# Patient Record
Sex: Female | Born: 1990 | Race: Black or African American | Hispanic: No | Marital: Single | Smoking: Current every day smoker
Health system: Southern US, Community
[De-identification: ages and names within clinical notes are randomized; demographics above are authoritative.]

## PROBLEM LIST (undated history)

## (undated) ENCOUNTER — Inpatient Hospital Stay (HOSPITAL_COMMUNITY): Payer: Self-pay

## (undated) DIAGNOSIS — B009 Herpesviral infection, unspecified: Secondary | ICD-10-CM

## (undated) HISTORY — DX: Herpesviral infection, unspecified: B00.9

## (undated) HISTORY — PX: NO PAST SURGERIES: SHX2092

---
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
## 2012-07-11 ENCOUNTER — Encounter (HOSPITAL_COMMUNITY): Payer: Self-pay | Admitting: Emergency Medicine

## 2012-07-11 ENCOUNTER — Emergency Department (HOSPITAL_COMMUNITY)
Admission: EM | Admit: 2012-07-11 | Discharge: 2012-07-11 | Disposition: A | Discharge status: Home / Self Care | Payer: Medicaid Other | Attending: Emergency Medicine | Admitting: Emergency Medicine

## 2012-07-11 DIAGNOSIS — N898 Other specified noninflammatory disorders of vagina: Secondary | ICD-10-CM | POA: Insufficient documentation

## 2012-07-11 DIAGNOSIS — Z3202 Encounter for pregnancy test, result negative: Secondary | ICD-10-CM | POA: Insufficient documentation

## 2012-07-11 DIAGNOSIS — N390 Urinary tract infection, site not specified: Secondary | ICD-10-CM | POA: Insufficient documentation

## 2012-07-11 DIAGNOSIS — F17200 Nicotine dependence, unspecified, uncomplicated: Secondary | ICD-10-CM | POA: Insufficient documentation

## 2012-07-11 DIAGNOSIS — A6000 Herpesviral infection of urogenital system, unspecified: Secondary | ICD-10-CM | POA: Insufficient documentation

## 2012-07-11 LAB — WET PREP, GENITAL
Trich, Wet Prep: NONE SEEN
Yeast Wet Prep HPF POC: NONE SEEN

## 2012-07-11 LAB — URINALYSIS, ROUTINE W REFLEX MICROSCOPIC
Protein, ur: 100 mg/dL — AB
Urobilinogen, UA: 4 mg/dL — ABNORMAL HIGH (ref 0.0–1.0)

## 2012-07-11 LAB — URINE MICROSCOPIC-ADD ON

## 2012-07-11 LAB — POCT PREGNANCY, URINE: Preg Test, Ur: NEGATIVE

## 2012-07-11 MED ORDER — NITROFURANTOIN MONOHYD MACRO 100 MG PO CAPS
100.0000 mg | ORAL_CAPSULE | Freq: Once | ORAL | Status: AC
  Administered 2012-07-11: 100 mg via ORAL
  Filled 2012-07-11: qty 1

## 2012-07-11 MED ORDER — NITROFURANTOIN MONOHYD MACRO 100 MG PO CAPS: 100.0000 mg | ORAL_CAPSULE | Freq: Two times a day (BID) | ORAL | Status: DC

## 2012-07-11 MED ORDER — HYDROCODONE-ACETAMINOPHEN 5-325 MG PO TABS: 1.0000 | ORAL_TABLET | Freq: Four times a day (QID) | ORAL | Status: DC | PRN

## 2012-07-11 MED ORDER — ONDANSETRON 4 MG PO TBDP
4.0000 mg | ORAL_TABLET | Freq: Once | ORAL | Status: AC
  Administered 2012-07-11: 4 mg via ORAL
  Filled 2012-07-11: qty 1

## 2012-07-11 MED ORDER — AZITHROMYCIN 1 G PO PACK
1.0000 g | PACK | Freq: Once | ORAL | Status: AC
  Administered 2012-07-11: 1 g via ORAL
  Filled 2012-07-11: qty 1

## 2012-07-11 MED ORDER — PHENAZOPYRIDINE HCL 200 MG PO TABS
200.0000 mg | ORAL_TABLET | Freq: Three times a day (TID) | ORAL | Status: DC
  Administered 2012-07-11: 200 mg via ORAL
  Filled 2012-07-11: qty 1

## 2012-07-11 MED ORDER — LIDOCAINE HCL 2 % EX GEL
Freq: Once | CUTANEOUS | Status: DC
  Filled 2012-07-11: qty 10

## 2012-07-11 MED ORDER — CEFTRIAXONE SODIUM 250 MG IJ SOLR
250.0000 mg | Freq: Once | INTRAMUSCULAR | Status: AC
  Administered 2012-07-11: 250 mg via INTRAMUSCULAR
  Filled 2012-07-11: qty 250

## 2012-07-11 MED ORDER — VALACYCLOVIR HCL 1 G PO TABS: 1000.0000 mg | ORAL_TABLET | Freq: Two times a day (BID) | ORAL | Status: AC

## 2012-07-11 MED ORDER — METRONIDAZOLE 500 MG PO TABS
2000.0000 mg | ORAL_TABLET | Freq: Once | ORAL | Status: AC
  Administered 2012-07-11: 2000 mg via ORAL
  Filled 2012-07-11: qty 4

## 2012-07-11 MED ORDER — HYDROCODONE-ACETAMINOPHEN 5-325 MG PO TABS
2.0000 | ORAL_TABLET | Freq: Once | ORAL | Status: AC
  Administered 2012-07-11: 2 via ORAL
  Filled 2012-07-11: qty 2

## 2012-07-11 NOTE — ED Provider Notes (Signed)
History     CSN: [id number]  Arrival date & time 07/11/12  0225   First MD Initiated Contact with Patient 07/11/12 [phone]      Chief Complaint  Patient presents with  . Vaginal Pain    (Consider location/radiation/quality/duration/timing/severity/associated sxs/prior treatment) HPI Brooke Salas is a 22 y.o. female who is had prior sexually transmitted infections x3 been having dysuria, frequency, or complaints of Lister's in her vaginal area, vaginal pain for about a week.  She says it's more painful to sit down in a chair and to walk. She also notes a worsening vaginal discharge. Pain is severe, 10 out of 10, sharp and burning worse with palpation or touching the area. Patient is currently sexual active with multiple partners.  History reviewed. No pertinent past medical history.  History reviewed. No pertinent past surgical history.  History reviewed. No pertinent family history.  History  Substance Use Topics  . Smoking status: Current Every Day Smoker -- 1.00 packs/day    Types: Cigarettes  . Smokeless tobacco: Not on file  . Alcohol Use: No    OB History   Grav Para Term Preterm Abortions TAB SAB Ect Mult Living                  Review of Systems At least 10pt or greater review of systems completed and are negative except where specified in the HPI.  Allergies  Review of patient's allergies indicates no known allergies.  Home Medications   Current Outpatient Rx  Name  Route  Sig  Dispense  Refill  . medroxyPROGESTERone (DEPO-PROVERA) 150 MG/ML injection   Intramuscular   Inject 150 mg into the muscle every 3 (three) months.           BP 133/76  Pulse 132  Temp(Src) 99.6 F (37.6 C) (Oral)  Resp 20  Wt 133 lb 12.8 oz (60.691 kg)  SpO2 100%  Physical Exam  Nursing notes reviewed.  Electronic medical record reviewed. VITAL SIGNS:   Filed Vitals:   07/11/12 0231 07/11/12 0637  BP: 133/76 130/91  Pulse: 132 93  Temp: 99.6 F (37.6 C)   TempSrc:  Oral   Resp: 20 16  Weight: 133 lb 12.8 oz (60.691 kg)   SpO2: 100% 99%   CONSTITUTIONAL: Awake, oriented, appears non-toxic HENT: Atraumatic, normocephalic, oral mucosa pink and moist, airway patent. Nares patent without drainage. External ears normal. EYES: Conjunctiva clear, EOMI, PERRLA NECK: Trachea midline, non-tender, supple CARDIOVASCULAR: Normal heart rate, Normal rhythm, No murmurs, rubs, gallops PULMONARY/CHEST: Clear to auscultation, no rhonchi, wheezes, or rales. Symmetrical breath sounds. Non-tender. ABDOMINAL: Non-distended, soft, non-tender - no rebound or guarding.  BS normal. NEUROLOGIC: Non-focal, moving all four extremities, no gross sensory or motor deficits. PELVIC EXAM: external genitalia with multiple ulcerations over the labia majora and minora, white discharge at full flow, vagina walls appear irritated, cervix with mucopurulent discharge, uterus and adnexa without abnormalities-no cervical motion tenderness EXTREMITIES: No clubbing, cyanosis, or edema SKIN: Warm, Dry, No erythema, No rash  ED Course  Procedures (including critical care time)  Labs Reviewed  WET PREP, GENITAL - Abnormal; Notable for the following:    Clue Cells Wet Prep HPF POC FEW (*)    WBC, Wet Prep HPF POC MANY (*)    All other components within normal limits  URINALYSIS, ROUTINE W REFLEX MICROSCOPIC - Abnormal; Notable for the following:    Color, Urine AMBER (*)    APPearance CLOUDY (*)    Specific Gravity, Urine  1.034 (*)    Hgb urine dipstick SMALL (*)    Bilirubin Urine SMALL (*)    Protein, ur 100 (*)    Urobilinogen, UA 4.0 (*)    Leukocytes, UA MODERATE (*)    All other components within normal limits  URINE MICROSCOPIC-ADD ON - Abnormal; Notable for the following:    Squamous Epithelial / LPF FEW (*)    Casts HYALINE CASTS (*)    All other components within normal limits  GC/CHLAMYDIA PROBE AMP  URINE CULTURE  POCT PREGNANCY, URINE   No results found.   1. Genital  herpes   2. Vaginal discharge   3. UTI (lower urinary tract infection)       MDM  Patient's presentation is consistent with PID, versus gonorrhea versus chlamydia, patient obvious has herpes with a greenish mucopurulent discharge. Wet prep does not show any trichomonads. We'll treat her with metronidazole, intramuscular ceftriaxone as well as 2 g and azithromycin. Have urged her to go to the health department complete STD testing including HIV and syphilis.  Patient is urged to return to the emergency department if she has worsening abdominal pain, fever, intractable vomiting or any other concerning symptoms.  Treat the patient's herpes with valacyclovir as well as topical 2% lidocaine jelly.  I explained the diagnosis and have given explicit precautions to return to the ER with any other new or worsening symptoms as dictated. The patient understands and accepts the medical plan as it's been dictated and I have answered their questions. Discharge instructions concerning home care and prescriptions have been given.  The patient is STABLE and is discharged to home in good condition.        Jones Skene, MD 07/11/12 [phone]

## 2012-07-11 NOTE — ED Notes (Signed)
Awaiting medication from pharmacy,  Spoke with Judeth Cornfield and they are going to send it shortly.

## 2012-07-11 NOTE — ED Notes (Signed)
Pt states have burning sensation went urinating, mother noted blisters in vaginal area. Underwear irritates it and sitting down in a chair is painful to pt. Pt also states white creamy vaginal discharge.

## 2012-07-13 LAB — URINE CULTURE

## 2012-07-13 LAB — GC/CHLAMYDIA PROBE AMP: GC Probe RNA: NEGATIVE

## 2012-07-14 ENCOUNTER — Telehealth (HOSPITAL_COMMUNITY): Payer: Self-pay | Admitting: Emergency Medicine

## 2012-07-14 NOTE — ED Notes (Signed)
Results received from [facility].  (+) URNC  Rx given in ED for Nitrofurantoin -> sensitive to the same.  Chart appended per protocol. 

## 2013-05-17 ENCOUNTER — Inpatient Hospital Stay (HOSPITAL_COMMUNITY)
Admission: EM | Admit: 2013-05-17 | Discharge: 2013-05-20 | DRG: 871 | Disposition: A | Discharge status: Home / Self Care | Payer: Self-pay | Attending: Internal Medicine | Admitting: Internal Medicine

## 2013-05-17 ENCOUNTER — Emergency Department (HOSPITAL_COMMUNITY): Payer: Medicaid Other

## 2013-05-17 ENCOUNTER — Encounter (HOSPITAL_COMMUNITY): Payer: Self-pay | Admitting: Emergency Medicine

## 2013-05-17 DIAGNOSIS — E871 Hypo-osmolality and hyponatremia: Secondary | ICD-10-CM | POA: Diagnosis present

## 2013-05-17 DIAGNOSIS — I498 Other specified cardiac arrhythmias: Secondary | ICD-10-CM | POA: Diagnosis present

## 2013-05-17 DIAGNOSIS — A419 Sepsis, unspecified organism: Principal | ICD-10-CM | POA: Diagnosis present

## 2013-05-17 DIAGNOSIS — R112 Nausea with vomiting, unspecified: Secondary | ICD-10-CM | POA: Diagnosis present

## 2013-05-17 DIAGNOSIS — Z79899 Other long term (current) drug therapy: Secondary | ICD-10-CM

## 2013-05-17 DIAGNOSIS — F17200 Nicotine dependence, unspecified, uncomplicated: Secondary | ICD-10-CM | POA: Diagnosis present

## 2013-05-17 DIAGNOSIS — E876 Hypokalemia: Secondary | ICD-10-CM | POA: Diagnosis present

## 2013-05-17 DIAGNOSIS — D72829 Elevated white blood cell count, unspecified: Secondary | ICD-10-CM | POA: Diagnosis present

## 2013-05-17 DIAGNOSIS — J189 Pneumonia, unspecified organism: Secondary | ICD-10-CM | POA: Diagnosis present

## 2013-05-17 DIAGNOSIS — R000 Tachycardia, unspecified: Secondary | ICD-10-CM

## 2013-05-17 MED ORDER — MORPHINE SULFATE 4 MG/ML IJ SOLN
4.0000 mg | Freq: Once | INTRAMUSCULAR | Status: AC
Start: 2013-05-17 — End: 2013-05-17
  Administered 2013-05-17: 4 mg via INTRAVENOUS
  Filled 2013-05-17: qty 1

## 2013-05-17 MED ORDER — ONDANSETRON HCL 4 MG/2ML IJ SOLN
4.0000 mg | Freq: Once | INTRAMUSCULAR | Status: AC
  Administered 2013-05-17: 4 mg via INTRAVENOUS
  Filled 2013-05-17: qty 2

## 2013-05-17 MED ORDER — SODIUM CHLORIDE 0.9 % IV BOLUS (SEPSIS)
1000.0000 mL | Freq: Once | INTRAVENOUS | Status: AC
  Administered 2013-05-17: 1000 mL via INTRAVENOUS

## 2013-05-17 MED ORDER — ACETAMINOPHEN 325 MG PO TABS
650.0000 mg | ORAL_TABLET | Freq: Once | ORAL | Status: AC
  Administered 2013-05-17: 650 mg via ORAL
  Filled 2013-05-17: qty 2

## 2013-05-17 NOTE — ED Provider Notes (Signed)
CSN: [id number]     Arrival date & time 05/17/13  2207 History   First MD Initiated Contact with Patient 05/17/13 2214     Chief Complaint  Patient presents with  . N/V/D   . Fever     (Consider location/radiation/quality/duration/timing/severity/associated sxs/prior Treatment) HPI Patient is a 23 year old female presenting with complaints of fever, nausea, vomiting, diarrhea, body aches, headache and cough for the past 4 days. The pain is diffuse and constant with worsening intensity beginning today which prompted her to be seen. Patient states she has a headache, neck pain and right ear pain. She has been drinking water and ginger ale to stay hydrated because she has had several episodes of vomiting and has had diarrhea once. Many of her family members have been sick recently but not to this extent. She tried taking 2 aleve but that did not relieve the pain.    History reviewed. No pertinent past medical history. History reviewed. No pertinent past surgical history. No family history on file. History  Substance Use Topics  . Smoking status: Current Every Day Smoker -- 1.00 packs/day    Types: Cigarettes  . Smokeless tobacco: Not on file  . Alcohol Use: No   OB History   Grav Para Term Preterm Abortions TAB SAB Ect Mult Living                 Review of Systems  Constitutional: Positive for fever, chills and fatigue.  HENT: Positive for ear pain.   Respiratory: Positive for shortness of breath.   Gastrointestinal: Positive for nausea, vomiting and diarrhea.  Genitourinary: Negative for dysuria.  Musculoskeletal: Positive for neck pain.  Neurological: Positive for light-headedness.    On review of systems, patient endorses fever, chills, headache, body aches, lightheadedness, neck pain, cough, nausea, vomiting, diarrhea. Patient denies hemoptysis, dysuria and constipation.  Allergies  Review of patient's allergies indicates no known allergies.  Home Medications   Current  Outpatient Rx  Name  Route  Sig  Dispense  Refill  . medroxyPROGESTERone (DEPO-PROVERA) 150 MG/ML injection   Intramuscular   Inject 150 mg into the muscle every 3 (three) months.          BP 122/66  Pulse 137  Temp(Src) 100.7 F (38.2 C) (Oral)  Resp 22  Ht 5\' 3"  (1.6 m)  Wt 135 lb (61.236 kg)  BMI 23.92 kg/m2  SpO2 100% Physical Exam  Constitutional: She appears distressed.  HENT:  Ears: Right TM mildly erythematous without effusion, left TM with normal appearance  Nose: Normal nares, no rhinorrhea   throat: Uvula is midline, no tonsillar enlargement or exudates, no trismus the    Neck: Normal range of motion.  Cardiovascular: Normal heart sounds.  Tachycardia present.  Exam reveals no gallop and no friction rub.   No murmur heard. Pulmonary/Chest: Tachypnea noted. She has rales in the left lower field.  Abdominal: There is no tenderness. There is no guarding and no CVA tenderness.  Skin: Skin is warm.    On physical exam, patient appears distressed. She is febrile, tachycardic, tachypneic, and normotensive. Patient is able to touch her chin to her chest. No murmurs, rubs or gallops. Rales heard posteriorly in base of left lung. Otherwise clear throughout. There is no guarding or evidence of abdominal pain upon palpation.    ED Course  Procedures (including critical care time)  1:10 AM Patient presents with initially URI symptoms. She was febrile, tachypneic and tachycardic without any wheezes. Chest x-ray demonstrated  complete consolidations of the left lower lobe suggestive of pneumonia. Patient endorse nausea and vomiting has had multiple bouts of vomitus. No obvious signs of hypotension. And she was responding to IV fluid. Levaquin given for community-acquired pneumonia. Due to her nausea vomiting, tachycardia, and febrile, we'll have her manage further in the hospital. Call for admission.  i have consulted with [facility], Dr. Onalee Huaavid, who will see pt in ER and  will admit for further care.    Labs Review Labs Reviewed  CBC WITH DIFFERENTIAL - Abnormal; Notable for the following:    WBC 14.1 (*)    Neutrophils Relative % 90 (*)    Neutro Abs 12.8 (*)    Lymphocytes Relative 6 (*)    All other components within normal limits  HEPATIC FUNCTION PANEL - Abnormal; Notable for the following:    Albumin 3.3 (*)    Total Bilirubin 1.4 (*)    Indirect Bilirubin 1.1 (*)    All other components within normal limits  I-STAT CHEM 8, ED - Abnormal; Notable for the following:    Sodium 134 (*)    Potassium 3.0 (*)    BUN 4 (*)    Glucose, Bld 115 (*)    Calcium, Ion 1.02 (*)    All other components within normal limits  RAPID STREP SCREEN  CULTURE, GROUP A STREP  LIPASE, BLOOD  I-STAT CG4 LACTIC ACID, ED   Imaging Review Dg Chest 2 View  05/18/2013   CLINICAL DATA:  Cough.  Fever.  EXAM: CHEST  2 VIEW  COMPARISON:  None.  FINDINGS: Almost complete consolidation of the left lower lobe. Recommend follow-up until clearance to exclude central obstructing lesion.  Central pulmonary vascular prominence.  Heart size within normal limits.  Scoliosis convex right.  No gross pneumothorax.  IMPRESSION: Almost complete consolidation left lung consistent with pneumonia. Recommend followup until clearance.  Scoliosis convex right.  Central pulmonary vascular prominence.  These results were called by telephone at the time of interpretation on 05/18/2013 at 12:42 AM to Dr. Fayrene HelperBOWIE Betzabe Bevans , who verbally acknowledged these results.   Electronically Signed   By: Bridgett LarssonSteve  Olson M.D.   On: 05/18/2013 00:42     EKG Interpretation   Date/Time:  Monday May 17 2013 23:44:12 EST Ventricular Rate:  134 PR Interval:  140 QRS Duration: 82 QT Interval:  296 QTC Calculation: 442 R Axis:   74 Text Interpretation:  Age not entered, assumed to be  23 years old for  purpose of ECG interpretation Sinus tachycardia Borderline T  abnormalities, diffuse leads Confirmed by HARRISON  MD,  FORREST ([id number]) on  05/18/2013 12:06:18 AM      MDM   Final diagnoses:  CAP (community acquired pneumonia)  Hypokalemia   BP 106/56  Pulse 127  Temp(Src) 103.5 F (39.7 C) (Rectal)  Resp 41  Ht 5\' 3"  (1.6 m)  Wt 135 lb (61.236 kg)  BMI 23.92 kg/m2  SpO2 97%  I have reviewed nursing notes and vital signs. I personally reviewed the imaging tests through PACS system  I reviewed available ER/hospitalization records thought the EMR     Fayrene HelperBowie Lonette Stevison, [place]-C 05/18/13 0112

## 2013-05-17 NOTE — ED Notes (Signed)
Pt c/o n/v/d, body aches, abdominal pain, headache and cough for the past 3 days. Pt states she has vomited 5 x today.

## 2013-05-18 DIAGNOSIS — E876 Hypokalemia: Secondary | ICD-10-CM

## 2013-05-18 DIAGNOSIS — R112 Nausea with vomiting, unspecified: Secondary | ICD-10-CM | POA: Diagnosis present

## 2013-05-18 DIAGNOSIS — I498 Other specified cardiac arrhythmias: Secondary | ICD-10-CM

## 2013-05-18 DIAGNOSIS — R000 Tachycardia, unspecified: Secondary | ICD-10-CM | POA: Diagnosis present

## 2013-05-18 DIAGNOSIS — J189 Pneumonia, unspecified organism: Secondary | ICD-10-CM | POA: Diagnosis present

## 2013-05-18 HISTORY — DX: Hypokalemia: E87.6

## 2013-05-18 HISTORY — DX: Pneumonia, unspecified organism: J18.9

## 2013-05-18 HISTORY — DX: Nausea with vomiting, unspecified: R11.2

## 2013-05-18 LAB — I-STAT CG4 LACTIC ACID, ED: Lactic Acid, Venous: 1.77 mmol/L (ref 0.5–2.2)

## 2013-05-18 LAB — CBC WITH DIFFERENTIAL/PLATELET
Basophils Absolute: 0 10*3/uL (ref 0.0–0.1)
Basophils Absolute: 0 10*3/uL (ref 0.0–0.1)
Basophils Relative: 0 % (ref 0–1)
Basophils Relative: 0 % (ref 0–1)
EOS PCT: 0 % (ref 0–5)
Eosinophils Absolute: 0 10*3/uL (ref 0.0–0.7)
Eosinophils Absolute: 0 10*3/uL (ref 0.0–0.7)
Eosinophils Relative: 0 % (ref 0–5)
HCT: 38 % (ref 36.0–46.0)
HEMATOCRIT: 33.7 % — AB (ref 36.0–46.0)
HEMOGLOBIN: 13.5 g/dL (ref 12.0–15.0)
Hemoglobin: 12 g/dL (ref 12.0–15.0)
LYMPHS ABS: 0.8 10*3/uL (ref 0.7–4.0)
Lymphocytes Relative: 6 % — ABNORMAL LOW (ref 12–46)
Lymphocytes Relative: 7 % — ABNORMAL LOW (ref 12–46)
Lymphs Abs: 0.8 10*3/uL (ref 0.7–4.0)
MCH: 31.3 pg (ref 26.0–34.0)
MCH: 31.4 pg (ref 26.0–34.0)
MCHC: 35.5 g/dL (ref 30.0–36.0)
MCHC: 35.6 g/dL (ref 30.0–36.0)
MCV: 88.2 fL (ref 78.0–100.0)
MCV: 88.2 fL (ref 78.0–100.0)
MONO ABS: 0.6 10*3/uL (ref 0.1–1.0)
MONO ABS: 0.4 10*3/uL (ref 0.1–1.0)
MONOS PCT: 4 % (ref 3–12)
Monocytes Relative: 4 % (ref 3–12)
Neutro Abs: 12.8 10*3/uL — ABNORMAL HIGH (ref 1.7–7.7)
Neutro Abs: 10 10*3/uL — ABNORMAL HIGH (ref 1.7–7.7)
Neutrophils Relative %: 90 % — ABNORMAL HIGH (ref 43–77)
Neutrophils Relative %: 90 % — ABNORMAL HIGH (ref 43–77)
Platelets: 225 10*3/uL (ref 150–400)
Platelets: 233 10*3/uL (ref 150–400)
RBC: 4.31 MIL/uL (ref 3.87–5.11)
RBC: 3.82 MIL/uL — AB (ref 3.87–5.11)
RDW: 13.7 % (ref 11.5–15.5)
RDW: 13.7 % (ref 11.5–15.5)
WBC: 14.1 10*3/uL — ABNORMAL HIGH (ref 4.0–10.5)
WBC: 11.2 10*3/uL — AB (ref 4.0–10.5)

## 2013-05-18 LAB — BASIC METABOLIC PANEL
BUN: 6 mg/dL (ref 6–23)
CHLORIDE: 99 meq/L (ref 96–112)
CO2: 22 meq/L (ref 19–32)
CREATININE: 0.85 mg/dL (ref 0.50–1.10)
Calcium: 8.1 mg/dL — ABNORMAL LOW (ref 8.4–10.5)
GFR calc Af Amer: >90 mL/min (ref >90)
GFR calc non Af Amer: >90 mL/min (ref >90)
GLUCOSE: 168 mg/dL — AB (ref 70–99)
Potassium: 3.8 mEq/L (ref 3.7–5.3)
Sodium: 135 mEq/L — ABNORMAL LOW (ref 137–147)

## 2013-05-18 LAB — I-STAT CHEM 8, ED
BUN: 4 mg/dL — ABNORMAL LOW (ref 6–23)
CALCIUM ION: 1.02 mmol/L — AB (ref 1.12–1.23)
Chloride: 96 mEq/L (ref 96–112)
Creatinine, Ser: 0.9 mg/dL (ref 0.50–1.10)
GLUCOSE: 115 mg/dL — AB (ref 70–99)
HEMATOCRIT: 42 % (ref 36.0–46.0)
Hemoglobin: 14.3 g/dL (ref 12.0–15.0)
Potassium: 3 mEq/L — ABNORMAL LOW (ref 3.7–5.3)
Sodium: 134 mEq/L — ABNORMAL LOW (ref 137–147)
TCO2: 23 mmol/L (ref 0–100)

## 2013-05-18 LAB — HEPATIC FUNCTION PANEL
ALT: 22 U/L (ref 0–35)
AST: 17 U/L (ref 0–37)
Albumin: 3.3 g/dL — ABNORMAL LOW (ref 3.5–5.2)
Alkaline Phosphatase: 82 U/L (ref 39–117)
BILIRUBIN DIRECT: 0.3 mg/dL (ref 0.0–0.3)
BILIRUBIN INDIRECT: 1.1 mg/dL — AB (ref 0.3–0.9)
Total Bilirubin: 1.4 mg/dL — ABNORMAL HIGH (ref 0.3–1.2)
Total Protein: 7.6 g/dL (ref 6.0–8.3)

## 2013-05-18 LAB — LIPASE, BLOOD: LIPASE: 11 U/L (ref 11–59)

## 2013-05-18 LAB — INFLUENZA PANEL BY PCR (TYPE A & B)
H1N1 flu by pcr: NOT DETECTED
Influenza A By PCR: NEGATIVE
Influenza B By PCR: NEGATIVE

## 2013-05-18 LAB — LEGIONELLA ANTIGEN, URINE: Legionella Antigen, Urine: NEGATIVE

## 2013-05-18 LAB — RAPID STREP SCREEN (MED CTR MEBANE ONLY): STREPTOCOCCUS, GROUP A SCREEN (DIRECT): NEGATIVE

## 2013-05-18 LAB — HIV ANTIBODY (ROUTINE TESTING W REFLEX): HIV: NONREACTIVE

## 2013-05-18 LAB — STREP PNEUMONIAE URINARY ANTIGEN: STREP PNEUMO URINARY ANTIGEN: NEGATIVE

## 2013-05-18 MED ORDER — POTASSIUM CHLORIDE CRYS ER 20 MEQ PO TBCR
40.0000 meq | EXTENDED_RELEASE_TABLET | Freq: Once | ORAL | Status: AC
  Administered 2013-05-18: 40 meq via ORAL
  Filled 2013-05-18: qty 2

## 2013-05-18 MED ORDER — LEVOFLOXACIN IN D5W 750 MG/150ML IV SOLN
750.0000 mg | INTRAVENOUS | Status: DC
  Administered 2013-05-18 – 2013-05-20 (×3): 750 mg via INTRAVENOUS
  Filled 2013-05-18 (×4): qty 150

## 2013-05-18 MED ORDER — IPRATROPIUM BROMIDE 0.02 % IN SOLN: 0.5000 mg | Freq: Once | RESPIRATORY_TRACT | Status: DC

## 2013-05-18 MED ORDER — POTASSIUM CHLORIDE IN NACL 40-0.9 MEQ/L-% IV SOLN
INTRAVENOUS | Status: AC
  Administered 2013-05-18: 04:00:00 via INTRAVENOUS
  Filled 2013-05-18 (×4): qty 1000

## 2013-05-18 MED ORDER — ACETAMINOPHEN 325 MG PO TABS
650.0000 mg | ORAL_TABLET | Freq: Four times a day (QID) | ORAL | Status: DC | PRN
  Administered 2013-05-18 – 2013-05-20 (×3): 650 mg via ORAL
  Filled 2013-05-18 (×3): qty 2

## 2013-05-18 MED ORDER — POTASSIUM CHLORIDE 10 MEQ/100ML IV SOLN
10.0000 meq | Freq: Once | INTRAVENOUS | Status: AC
  Administered 2013-05-18: 10 meq via INTRAVENOUS
  Filled 2013-05-18: qty 100

## 2013-05-18 MED ORDER — LEVOFLOXACIN IN D5W 750 MG/150ML IV SOLN
750.0000 mg | Freq: Once | INTRAVENOUS | Status: AC
Start: 2013-05-18 — End: 2013-05-18
  Filled 2013-05-18: qty 150

## 2013-05-18 MED ORDER — ONDANSETRON HCL 4 MG/2ML IJ SOLN: 4.0000 mg | Freq: Four times a day (QID) | INTRAMUSCULAR | Status: DC | PRN

## 2013-05-18 MED ORDER — SODIUM CHLORIDE 0.9 % IV SOLN
INTRAVENOUS | Status: AC
  Administered 2013-05-18: 21:00:00 via INTRAVENOUS

## 2013-05-18 MED ORDER — GUAIFENESIN-DM 100-10 MG/5ML PO SYRP
5.0000 mL | ORAL_SOLUTION | ORAL | Status: DC | PRN
  Administered 2013-05-18 – 2013-05-19 (×3): 5 mL via ORAL
  Filled 2013-05-18 (×3): qty 10

## 2013-05-18 MED ORDER — SODIUM CHLORIDE 0.9 % IV BOLUS (SEPSIS)
1000.0000 mL | Freq: Once | INTRAVENOUS | Status: AC
Start: 2013-05-18 — End: 2013-05-18
  Administered 2013-05-18: 1000 mL via INTRAVENOUS

## 2013-05-18 MED ORDER — ALBUTEROL SULFATE (2.5 MG/3ML) 0.083% IN NEBU: 5.0000 mg | INHALATION_SOLUTION | Freq: Once | RESPIRATORY_TRACT | Status: DC

## 2013-05-18 NOTE — Progress Notes (Signed)
[facility] PROGRESS NOTE  Brooke Salas ZHY:[id number] DOB: [birth year] DOA: 05/17/2013 PCP: PROVIDER NOT IN SYSTEM  Assessment/Plan  Sepsis (fever to 103.41F and tachcardia) due to CAP, improving.   -  Radiologist recommended follow up CXR to exclude central obstructing lesion -  Continue levofloxacin -  Continue IVF -  F/u BCx -  S. pneumo neg -  Legionella pending -  Flu neg  Sinus tachycardia, trending down on telemetry -  D/c telemetry -  Continue IVF and abx  Leukocytosis, resolving  Hyponatremia and hypokalemia due to dehydration, resolving with IVF and potassium supplement  Diet:  regular Access:  PIV IVF:  yes Proph:  SCDs  Code Status: full Family Communication: patient alone Disposition Plan: pending improvement in HR, possibly home tomrrow   Consultants:  none  Procedures:  CXR  Antibiotics:  Levofloxacin 3/3 >>  HPI/Subjective:  Feeling a little better.  Fever has improved.  Still coughing, but not SOB.   Objective: Filed Vitals:   05/18/13 0100 05/18/13 0254 05/18/13 0552 05/18/13 1334  BP: 113/64 112/73 111/70 112/75  Pulse: 116 118 102 111  Temp:  98.9 F (37.2 C) 97.8 F (36.6 C) 99 F (37.2 C)  TempSrc:  Oral Oral Oral  Resp: 24 22 20 18   Height:  5\' 3"  (1.6 m)    Weight:  52.3 kg (115 lb 4.8 oz)    SpO2: 96% 97% 100% 99%    Intake/Output Summary (Last 24 hours) at 05/18/13 1844 Last data filed at 05/18/13 1752  Gross per 24 hour  Intake 3391.67 ml  Output   2001 ml  Net 1390.67 ml   Filed Weights   05/17/13 2214 05/18/13 0254  Weight: 61.236 kg (135 lb) 52.3 kg (115 lb 4.8 oz)    Exam:   General:  BF, No acute distress  HEENT:  NCAT, MMM  Cardiovascular:  Tachycardic RR, nl S1, S2 no mrg, 2+ pulses, warm extremities  Respiratory:  Very diminished/bronchial BS with egophony at the left base, no increased WOB  Abdomen:   NABS, soft, NT/ND  MSK:   Normal tone and bulk, no LEE  Neuro:  Grossly  intact  Data Reviewed: Basic Metabolic Panel:  Recent Labs Lab 05/18/13 0015 05/18/13 0340  NA 134* 135*  K 3.0* 3.8  CL 96 99  CO2  --  22  GLUCOSE 115* 168*  BUN 4* 6  CREATININE 0.90 0.85  CALCIUM  --  8.1*   Liver Function Tests:  Recent Labs Lab 05/17/13 2300  AST 17  ALT 22  ALKPHOS 82  BILITOT 1.4*  PROT 7.6  ALBUMIN 3.3*    Recent Labs Lab 05/17/13 2300  LIPASE 11   No results found for this basename: AMMONIA,  in the last 168 hours CBC:  Recent Labs Lab 05/17/13 2300 05/18/13 0015 05/18/13 0340  WBC 14.1*  --  11.2*  NEUTROABS 12.8*  --  10.0*  HGB 13.5 14.3 12.0  HCT 38.0 42.0 33.7*  MCV 88.2  --  88.2  PLT 225  --  233   Cardiac Enzymes: No results found for this basename: CKTOTAL, CKMB, CKMBINDEX, TROPONINI,  in the last 168 hours BNP (last 3 results) No results found for this basename: PROBNP,  in the last 8760 hours CBG: No results found for this basename: GLUCAP,  in the last 168 hours  Recent Results (from the past 240 hour(s))  RAPID STREP SCREEN     Status: None   Collection Time  05/18/13 12:26 AM      Result Value Ref Range Status   Streptococcus, Group A Screen (Direct) NEGATIVE  NEGATIVE Final   Comment: (NOTE)     A Rapid Antigen test may result negative if the antigen level in the     sample is below the detection level of this test. The FDA has not     cleared this test as a stand-alone test therefore the rapid antigen     negative result has reflexed to a Group A Strep culture.     Studies: Dg Chest 2 View  05/18/2013   CLINICAL DATA:  Cough.  Fever.  EXAM: CHEST  2 VIEW  COMPARISON:  None.  FINDINGS: Almost complete consolidation of the left lower lobe. Recommend follow-up until clearance to exclude central obstructing lesion.  Central pulmonary vascular prominence.  Heart size within normal limits.  Scoliosis convex right.  No gross pneumothorax.  IMPRESSION: Almost complete consolidation left lung consistent with  pneumonia. Recommend followup until clearance.  Scoliosis convex right.  Central pulmonary vascular prominence.  These results were called by telephone at the time of interpretation on 05/18/2013 at 12:42 AM to Dr. Fayrene HelperBOWIE TRAN , who verbally acknowledged these results.   Electronically Signed   By: Bridgett LarssonSteve  Olson M.D.   On: 05/18/2013 00:42    Scheduled Meds: . levofloxacin (LEVAQUIN) IV  750 mg Intravenous Q24H   Continuous Infusions: . sodium chloride      Principal Problem:   PNA (pneumonia) Active Problems:   Sinus tachycardia   Nausea & vomiting   Hypokalemia    Time spent: 30 min    Maximina Pirozzi  [facility] Pager [phone]. If 7PM-7AM, please contact night-coverage at [email or web address], password [email or web address] 05/18/2013, 6:44 PM  LOS: 1 day

## 2013-05-18 NOTE — H&P (Signed)
Chief Complaint:  Fever, cough, n/v  HPI: 23 yo female healthy started with nasal congestion, general malaise about 4 days ago.  She had no fever then.  Her mother and daughter were sick with the same thing.  They got better.  However she over the last day has started with high fevers, worsening cough.  A lot of vomiting after she coughs which is nonbloody.  Persistent headache.  Feels awful.  Not able to keep anything down.  No recent illnesses or abx use.  She works at [employer].  She feels better with some ivf in the ED.    Review of Systems:  Positive and negative as per HPI otherwise all other systems are negative  Past Medical History: History reviewed. No pertinent past medical history. History reviewed. No pertinent past surgical history.  Medications: Prior to Admission medications   Medication Sig Start Date End Date Taking? Authorizing Provider  medroxyPROGESTERone (DEPO-PROVERA) 150 MG/ML injection Inject 150 mg into the muscle every 3 (three) months.   Yes Historical Provider, MD    Allergies:  No Known Allergies  Social History:  reports that she has been smoking Cigarettes.  She has been smoking about 1.00 pack per day. She does not have any smokeless tobacco history on file. She reports that she does not drink alcohol. Her drug history is not on file.  Family History: none  Physical Exam: Filed Vitals:   05/17/13 2214 05/17/13 2224 05/18/13 0000 05/18/13 0027  BP: 122/66  106/56   Pulse: 134 137 127   Temp: 100.7 F (38.2 C)   103.5 F (39.7 C)  TempSrc: Oral   Rectal  Resp: 22  41   Height: 5\' 3"  (1.6 m)     Weight: 61.236 kg (135 lb)     SpO2: 100%  97%    General appearance: alert, cooperative and no distress Head: Normocephalic, without obvious abnormality, atraumatic Eyes: negative Nose: Nares normal. Septum midline. Mucosa normal. No drainage or sinus tenderness. Neck: no JVD and supple, symmetrical, trachea midline Lungs: clear to auscultation  bilaterally Heart: regular rate and rhythm, S1, S2 normal, no murmur, click, rub or gallop Abdomen: soft, non-tender; bowel sounds normal; no masses,  no organomegaly Extremities: extremities normal, atraumatic, no cyanosis or edema Pulses: 2+ and symmetric Skin: Skin color, texture, turgor normal. No rashes or lesions Neurologic: Grossly normal    Labs on Admission:   Recent Labs  05/18/13 0015  NA 134*  K 3.0*  CL 96  GLUCOSE 115*  BUN 4*  CREATININE 0.90    Recent Labs  05/17/13 2300  AST 17  ALT 22  ALKPHOS 82  BILITOT 1.4*  PROT 7.6  ALBUMIN 3.3*    Recent Labs  05/17/13 2300  LIPASE 11    Recent Labs  05/17/13 2300 05/18/13 0015  WBC 14.1*  --   NEUTROABS 12.8*  --   HGB 13.5 14.3  HCT 38.0 42.0  MCV 88.2  --   PLT 225  --     Radiological Exams on Admission: Dg Chest 2 View  05/18/2013   CLINICAL DATA:  Cough.  Fever.  EXAM: CHEST  2 VIEW  COMPARISON:  None.  FINDINGS: Almost complete consolidation of the left lower lobe. Recommend follow-up until clearance to exclude central obstructing lesion.  Central pulmonary vascular prominence.  Heart size within normal limits.  Scoliosis convex right.  No gross pneumothorax.  IMPRESSION: Almost complete consolidation left lung consistent with pneumonia. Recommend followup until clearance.  Scoliosis  convex right.  Central pulmonary vascular prominence.  These results were called by telephone at the time of interpretation on 05/18/2013 at 12:42 AM to Dr. Fayrene HelperBOWIE TRAN , who verbally acknowledged these results.   Electronically Signed   By: Bridgett LarssonSteve  Olson M.D.   On: 05/18/2013 00:42    Assessment/Plan  23 yo female with CAP  Principal Problem:   PNA (pneumonia)-  Her tachycardia improving with ivf, and tx of her fever.  Place on pna pathway, levaquin.  Robitussin.  Cont ivf overnight.  o2 sats are normal.  Lactic acid level normal.  Should improve over the next 24 hours with ivf and abx.  Active Problems:   Sinus  tachycardia   Nausea & vomiting   Hypokalemia  Replete in ivf  Pt needs to be discharged by tomorrow afternoon, [place], so is going to have child care issues for her daughter.  Possible to be d/c tomorrow afternoon is continues to improve with above treatment.  FULL CODE.  Jovanni Rash A 05/18/2013, 1:46 AM

## 2013-05-18 NOTE — Progress Notes (Signed)
Spoke with Infection Control and due to elevated temperature/sore throat with a negative PCR for flu still needs to remain on droplet isolation as ordered.

## 2013-05-18 NOTE — Care Management Note (Signed)
    Page 1 of 1   05/18/2013     11:30:34 AM   CARE MANAGEMENT NOTE 05/18/2013  Patient:  Brooke Salas,Brooke Salas   Account Number:  [id number]  Date Initiated:  05/18/2013  Documentation initiated by:  Lorenda IshiharaPEELE,Tiajuana Leppanen  Subjective/Objective Assessment:   23 yo female admitted with PNA. PTA lived at home with mother.     Action/Plan:   Home when stable   Anticipated DC Date:  05/18/2013   Anticipated DC Plan:  HOME/SELF CARE      DC Planning Services  CM consult      Choice offered to / List presented to:             Status of service:  Completed, signed off Medicare Important Message given?   (If response is "NO", the following Medicare IM given date fields will be blank) Date Medicare IM given:   Date Additional Medicare IM given:    Discharge Disposition:  HOME/SELF CARE  Per UR Regulation:  Reviewed for med. necessity/level of care/duration of stay  If discussed at Long Length of Stay Meetings, dates discussed:    Comments:

## 2013-05-18 NOTE — ED Provider Notes (Signed)
Medical screening examination/treatment/procedure(s) were performed by non-physician practitioner and as supervising physician I was immediately available for consultation/collaboration.   EKG Interpretation   Date/Time:  Monday May 17 2013 23:44:12 EST Ventricular Rate:  134 PR Interval:  140 QRS Duration: 82 QT Interval:  296 QTC Calculation: 442 R Axis:   74 Text Interpretation:  Age not entered, assumed to be  23 years old for  purpose of ECG interpretation Sinus tachycardia Borderline T  abnormalities, diffuse leads Confirmed by Romeo AppleHARRISON  MD, Rohith Fauth ([id number]) on  05/18/2013 12:06:18 AM        Brooke ArgyleForrest S Zania Kalisz, MD 05/18/13 1901

## 2013-05-18 NOTE — ED Notes (Signed)
Patient 98-100 ambulating

## 2013-05-19 LAB — CULTURE, GROUP A STREP

## 2013-05-19 MED ORDER — KETOROLAC TROMETHAMINE 15 MG/ML IJ SOLN
30.0000 mg | Freq: Four times a day (QID) | INTRAMUSCULAR | Status: DC | PRN
  Administered 2013-05-19: 30 mg via INTRAVENOUS
  Filled 2013-05-19 (×2): qty 2

## 2013-05-19 NOTE — Progress Notes (Signed)
[facility] PROGRESS NOTE  Brooke Salas:[id number] DOB: 1990-06-11 DOA: 05/17/2013 PCP: PROVIDER NOT IN SYSTEM  Assessment/Plan: 1. Sepsis (fever to 103.75F and tachcardia) due to CAP, improving.  - Radiologist recommended follow up CXR to exclude central obstructing lesion  - Continue levofloxacin  - Continue IVF  - F/u BCx NGTD - S. pneumo neg  - Legionella pending  - Flu neg  -febrile 3/3 2. Sinus tachycardia, trending down on telemetry  - D/c telemetry  - Continue IVF and abx  3. Leukocytosis, resolving  4. Hyponatremia and hypokalemia due to dehydration, resolving with IVF and potassium supplement    Diet: regular  Access: PIV  IVF: yes  Proph: SCDs  Code Status: full  Family Communication: patient alone  Disposition Plan: pending improvement in HR, possibly home tomrrow  Consultants:  none Procedures:  CXR Antibiotics:  Levofloxacin 3/3 >>   Code Status: full Family Communication: d/w aptient (indicate person spoken with, relationship, and if by phone, the number) Disposition Plan: home 24-48 hours    Consultants:  None   Procedures:  None   HPI/Subjective: alert  Objective: Filed Vitals:   05/19/13 0640  BP: 102/68  Pulse: 82  Temp: 97.9 F (36.6 C)  Resp: 16    Intake/Output Summary (Last 24 hours) at 05/19/13 1043 Last data filed at 05/19/13 0500  Gross per 24 hour  Intake   1470 ml  Output   1200 ml  Net    270 ml   Filed Weights   05/17/13 2214 05/18/13 0254  Weight: 61.236 kg (135 lb) 52.3 kg (115 lb 4.8 oz)    Exam:   General:  alert  Cardiovascular: s1,s2 rrr  Respiratory: LLL rales   Abdomen: soft, nt, nd   Musculoskeletal: no LE edema   Data Reviewed: Basic Metabolic Panel:  Recent Labs Lab 05/18/13 0015 05/18/13 0340  NA 134* 135*  K 3.0* 3.8  CL 96 99  CO2  --  22  GLUCOSE 115* 168*  BUN 4* 6  CREATININE 0.90 0.85  CALCIUM  --  8.1*   Liver Function Tests:  Recent Labs Lab  05/17/13 2300  AST 17  ALT 22  ALKPHOS 82  BILITOT 1.4*  PROT 7.6  ALBUMIN 3.3*    Recent Labs Lab 05/17/13 2300  LIPASE 11   No results found for this basename: AMMONIA,  in the last 168 hours CBC:  Recent Labs Lab 05/17/13 2300 05/18/13 0015 05/18/13 0340  WBC 14.1*  --  11.2*  NEUTROABS 12.8*  --  10.0*  HGB 13.5 14.3 12.0  HCT 38.0 42.0 33.7*  MCV 88.2  --  88.2  PLT 225  --  233   Cardiac Enzymes: No results found for this basename: CKTOTAL, CKMB, CKMBINDEX, TROPONINI,  in the last 168 hours BNP (last 3 results) No results found for this basename: PROBNP,  in the last 8760 hours CBG: No results found for this basename: GLUCAP,  in the last 168 hours  Recent Results (from the past 240 hour(s))  RAPID STREP SCREEN     Status: None   Collection Time    05/18/13 12:26 AM      Result Value Ref Range Status   Streptococcus, Group A Screen (Direct) NEGATIVE  NEGATIVE Final   Comment: (NOTE)     A Rapid Antigen test may result negative if the antigen level in the     sample is below the detection level of this test. The FDA has not  cleared this test as a stand-alone test therefore the rapid antigen     negative result has reflexed to a Group A Strep culture.  CULTURE, GROUP A STREP     Status: None   Collection Time    05/18/13 12:26 AM      Result Value Ref Range Status   Specimen Description THROAT   Final   Special Requests NONE   Final   Culture     Final   Value: NO SUSPICIOUS COLONIES, CONTINUING TO HOLD     Performed at [facility]   Report Status PENDING   Incomplete  CULTURE, BLOOD (ROUTINE X 2)     Status: None   Collection Time    05/18/13  3:40 AM      Result Value Ref Range Status   Specimen Description BLOOD LEFT ANTECUBITAL   Final   Special Requests BOTTLES DRAWN AEROBIC AND ANAEROBIC [facility]6CC   Final   Culture  Setup Time     Final   Value: 05/18/2013 08:57     Performed at [facility]   Culture     Final   Value:         BLOOD CULTURE RECEIVED NO GROWTH TO DATE CULTURE WILL BE HELD FOR 5 DAYS BEFORE ISSUING A FINAL NEGATIVE REPORT     Performed at [facility]   Report Status PENDING   Incomplete  CULTURE, BLOOD (ROUTINE X 2)     Status: None   Collection Time    05/18/13  3:45 AM      Result Value Ref Range Status   Specimen Description BLOOD LEFT HAND   Final   Special Requests BOTTLES DRAWN AEROBIC ONLY 5CC   Final   Culture  Setup Time     Final   Value: 05/18/2013 08:56     Performed at [facility]   Culture     Final   Value:        BLOOD CULTURE RECEIVED NO GROWTH TO DATE CULTURE WILL BE HELD FOR 5 DAYS BEFORE ISSUING A FINAL NEGATIVE REPORT     Performed at [facility]   Report Status PENDING   Incomplete     Studies: Dg Chest 2 View  05/18/2013   CLINICAL DATA:  Cough.  Fever.  EXAM: CHEST  2 VIEW  COMPARISON:  None.  FINDINGS: Almost complete consolidation of the left lower lobe. Recommend follow-up until clearance to exclude central obstructing lesion.  Central pulmonary vascular prominence.  Heart size within normal limits.  Scoliosis convex right.  No gross pneumothorax.  IMPRESSION: Almost complete consolidation left lung consistent with pneumonia. Recommend followup until clearance.  Scoliosis convex right.  Central pulmonary vascular prominence.  These results were called by telephone at the time of interpretation on 05/18/2013 at 12:42 AM to Dr. Fayrene HelperBOWIE TRAN , who verbally acknowledged these results.   Electronically Signed   By: Bridgett LarssonSteve  Olson M.D.   On: 05/18/2013 00:42    Scheduled Meds: . levofloxacin (LEVAQUIN) IV  750 mg Intravenous Q24H   Continuous Infusions:   Principal Problem:   PNA (pneumonia) Active Problems:   Sinus tachycardia   Nausea & vomiting   Hypokalemia    Time spent: >35 minutes     Esperanza SheetsBURIEV, Yasaman Kolek N  [facility] Pager [phone]. If 7PM-7AM, please contact night-coverage at [email or web address], password [email or web address] 05/19/2013, 10:43 AM   LOS: 2 days

## 2013-05-20 DIAGNOSIS — R112 Nausea with vomiting, unspecified: Secondary | ICD-10-CM

## 2013-05-20 MED ORDER — LEVOFLOXACIN 750 MG PO TABS: 750.0000 mg | ORAL_TABLET | Freq: Every day | ORAL | Status: DC

## 2013-05-20 NOTE — Discharge Summary (Signed)
Physician Discharge Summary  Brooke Salas ZOX:[id number] DOB: [birth year] DOA: 05/17/2013  PCP: PROVIDER NOT IN SYSTEM  Admit date: 05/17/2013 Discharge date: 05/20/2013  Time spent: >35 minutes  Recommendations for Outpatient Follow-up:  F/u with PCP in 3-4 weeks to f/u CXR Discharge Diagnoses:  Principal Problem:   PNA (pneumonia) Active Problems:   Sinus tachycardia   Nausea & vomiting   Hypokalemia   Discharge Condition: stable   Diet recommendation: regular   Filed Weights   05/17/13 2214 05/18/13 0254  Weight: 61.236 kg (135 lb) 52.3 kg (115 lb 4.8 oz)    History of present illness/Hospital Course:  1. Sepsis (fever to 103.38F and tachcardia) due to CAP, improved.  - improved on IV atx, IVF; F/u BCx NGTD; febrile 3/3  -changed to PO atx to complete outpatient treatment, and f/u CXR in 3-4 weeks  2. Sinus tachycardia, trending down on telemetry  - resolved   3. Leukocytosis, resolving  4. Hyponatremia and hypokalemia due to dehydration, resolving with IVF and potassium supplement     Procedures:  None n (i.e. Studies not automatically included, echos, thoracentesis, etc; not x-rays)  Consultations:  None   Discharge Exam: Filed Vitals:   05/20/13 0541  BP: 107/60  Pulse: 82  Temp: 98.2 F (36.8 C)  Resp: 22    General: alert Cardiovascular: s1,s2 rrr Respiratory: CTA BL  Discharge Instructions  Discharge Orders   Future Orders Complete By Expires   Diet - low sodium heart healthy  As directed    Discharge instructions  As directed    Comments:     Please follow up with primary care doctor in 2-3 weeks   Increase activity slowly  As directed        Medication List         levofloxacin 750 MG tablet  Commonly known as:  LEVAQUIN  Take 1 tablet (750 mg total) by mouth daily.     medroxyPROGESTERone 150 MG/ML injection  Commonly known as:  DEPO-PROVERA  Inject 150 mg into the muscle every 3 (three) months.       No Known Allergies      Follow-up Information   Follow up with PROVIDER NOT IN SYSTEM.      Follow up with [facility]     In 3 weeks.   Contact information:   [address] [place] [address] [phone]       The results of significant diagnostics from this hospitalization (including imaging, microbiology, ancillary and laboratory) are listed below for reference.    Significant Diagnostic Studies: Dg Chest 2 View  05/18/2013   CLINICAL DATA:  Cough.  Fever.  EXAM: CHEST  2 VIEW  COMPARISON:  None.  FINDINGS: Almost complete consolidation of the left lower lobe. Recommend follow-up until clearance to exclude central obstructing lesion.  Central pulmonary vascular prominence.  Heart size within normal limits.  Scoliosis convex right.  No gross pneumothorax.  IMPRESSION: Almost complete consolidation left lung consistent with pneumonia. Recommend followup until clearance.  Scoliosis convex right.  Central pulmonary vascular prominence.  These results were called by telephone at the time of interpretation on 05/18/2013 at 12:42 AM to Dr. Fayrene Helper , who verbally acknowledged these results.   Electronically Signed   By: Bridgett Larsson M.D.   On: 05/18/2013 00:42    Microbiology: Recent Results (from the past 240 hour(s))  RAPID STREP SCREEN     Status: None   Collection Time  05/18/13 12:26 AM      Result Value Ref Range Status   Streptococcus, Group A Screen (Direct) NEGATIVE  NEGATIVE Final   Comment: (NOTE)     A Rapid Antigen test may result negative if the antigen level in the     sample is below the detection level of this test. The FDA has not     cleared this test as a stand-alone test therefore the rapid antigen     negative result has reflexed to a Group A Strep culture.  CULTURE, GROUP A STREP     Status: None   Collection Time    05/18/13 12:26 AM      Result Value Ref Range Status   Specimen Description THROAT   Final   Special Requests NONE   Final    Culture     Final   Value: No Beta Hemolytic Streptococci Isolated     Performed at [facility]   Report Status 05/19/2013 FINAL   Final  CULTURE, BLOOD (ROUTINE X 2)     Status: None   Collection Time    05/18/13  3:40 AM      Result Value Ref Range Status   Specimen Description BLOOD LEFT ANTECUBITAL   Final   Special Requests BOTTLES DRAWN AEROBIC AND ANAEROBIC 6CC   Final   Culture  Setup Time     Final   Value: 05/18/2013 08:57     Performed at [facility]   Culture     Final   Value:        BLOOD CULTURE RECEIVED NO GROWTH TO DATE CULTURE WILL BE HELD FOR 5 DAYS BEFORE ISSUING A FINAL NEGATIVE REPORT     Performed at [facility]   Report Status PENDING   Incomplete  CULTURE, BLOOD (ROUTINE X 2)     Status: None   Collection Time    05/18/13  3:45 AM      Result Value Ref Range Status   Specimen Description BLOOD LEFT HAND   Final   Special Requests BOTTLES DRAWN AEROBIC ONLY 5CC   Final   Culture  Setup Time     Final   Value: 05/18/2013 08:56     Performed at [facility]   Culture     Final   Value:        BLOOD CULTURE RECEIVED NO GROWTH TO DATE CULTURE WILL BE HELD FOR 5 DAYS BEFORE ISSUING A FINAL NEGATIVE REPORT     Performed at [facility]   Report Status PENDING   Incomplete     Labs: Basic Metabolic Panel:  Recent Labs Lab 05/18/13 0015 05/18/13 0340  NA 134* 135*  K 3.0* 3.8  CL 96 99  CO2  --  22  GLUCOSE 115* 168*  BUN 4* 6  CREATININE 0.90 0.85  CALCIUM  --  8.1*   Liver Function Tests:  Recent Labs Lab 05/17/13 2300  AST 17  ALT 22  ALKPHOS 82  BILITOT 1.4*  PROT 7.6  ALBUMIN 3.3*    Recent Labs Lab 05/17/13 2300  LIPASE 11   No results found for this basename: AMMONIA,  in the last 168 hours CBC:  Recent Labs Lab 05/17/13 2300 05/18/13 0015 05/18/13 0340  WBC 14.1*  --  11.2*  NEUTROABS 12.8*  --  10.0*  HGB 13.5 14.3 12.0  HCT 38.0 42.0 33.7*  MCV 88.2  --  88.2  PLT 225   --  233   Cardiac Enzymes: No results found for this basename: CKTOTAL, CKMB, CKMBINDEX, TROPONINI,  in the last 168 hours BNP: BNP (last 3 results) No results found for this basename: PROBNP,  in the last 8760 hours CBG: No results found for this basename: GLUCAP,  in the last 168 hours     Signed:  Esperanza Sheets  [facility] 05/20/2013, 10:08 AM

## 2013-05-20 NOTE — Progress Notes (Signed)
Pt refused to be set up on [email or web address]

## 2013-05-24 LAB — CULTURE, BLOOD (ROUTINE X 2)
Culture: NO GROWTH
Culture: NO GROWTH

## 2014-06-30 IMAGING — CR DG CHEST 2V
2 series · 2 of 2 positions shown · non-contrast
Comparison: None.

CLINICAL DATA: Cough.  Fever.

EXAM:
CHEST  2 VIEW

[w chest pa]
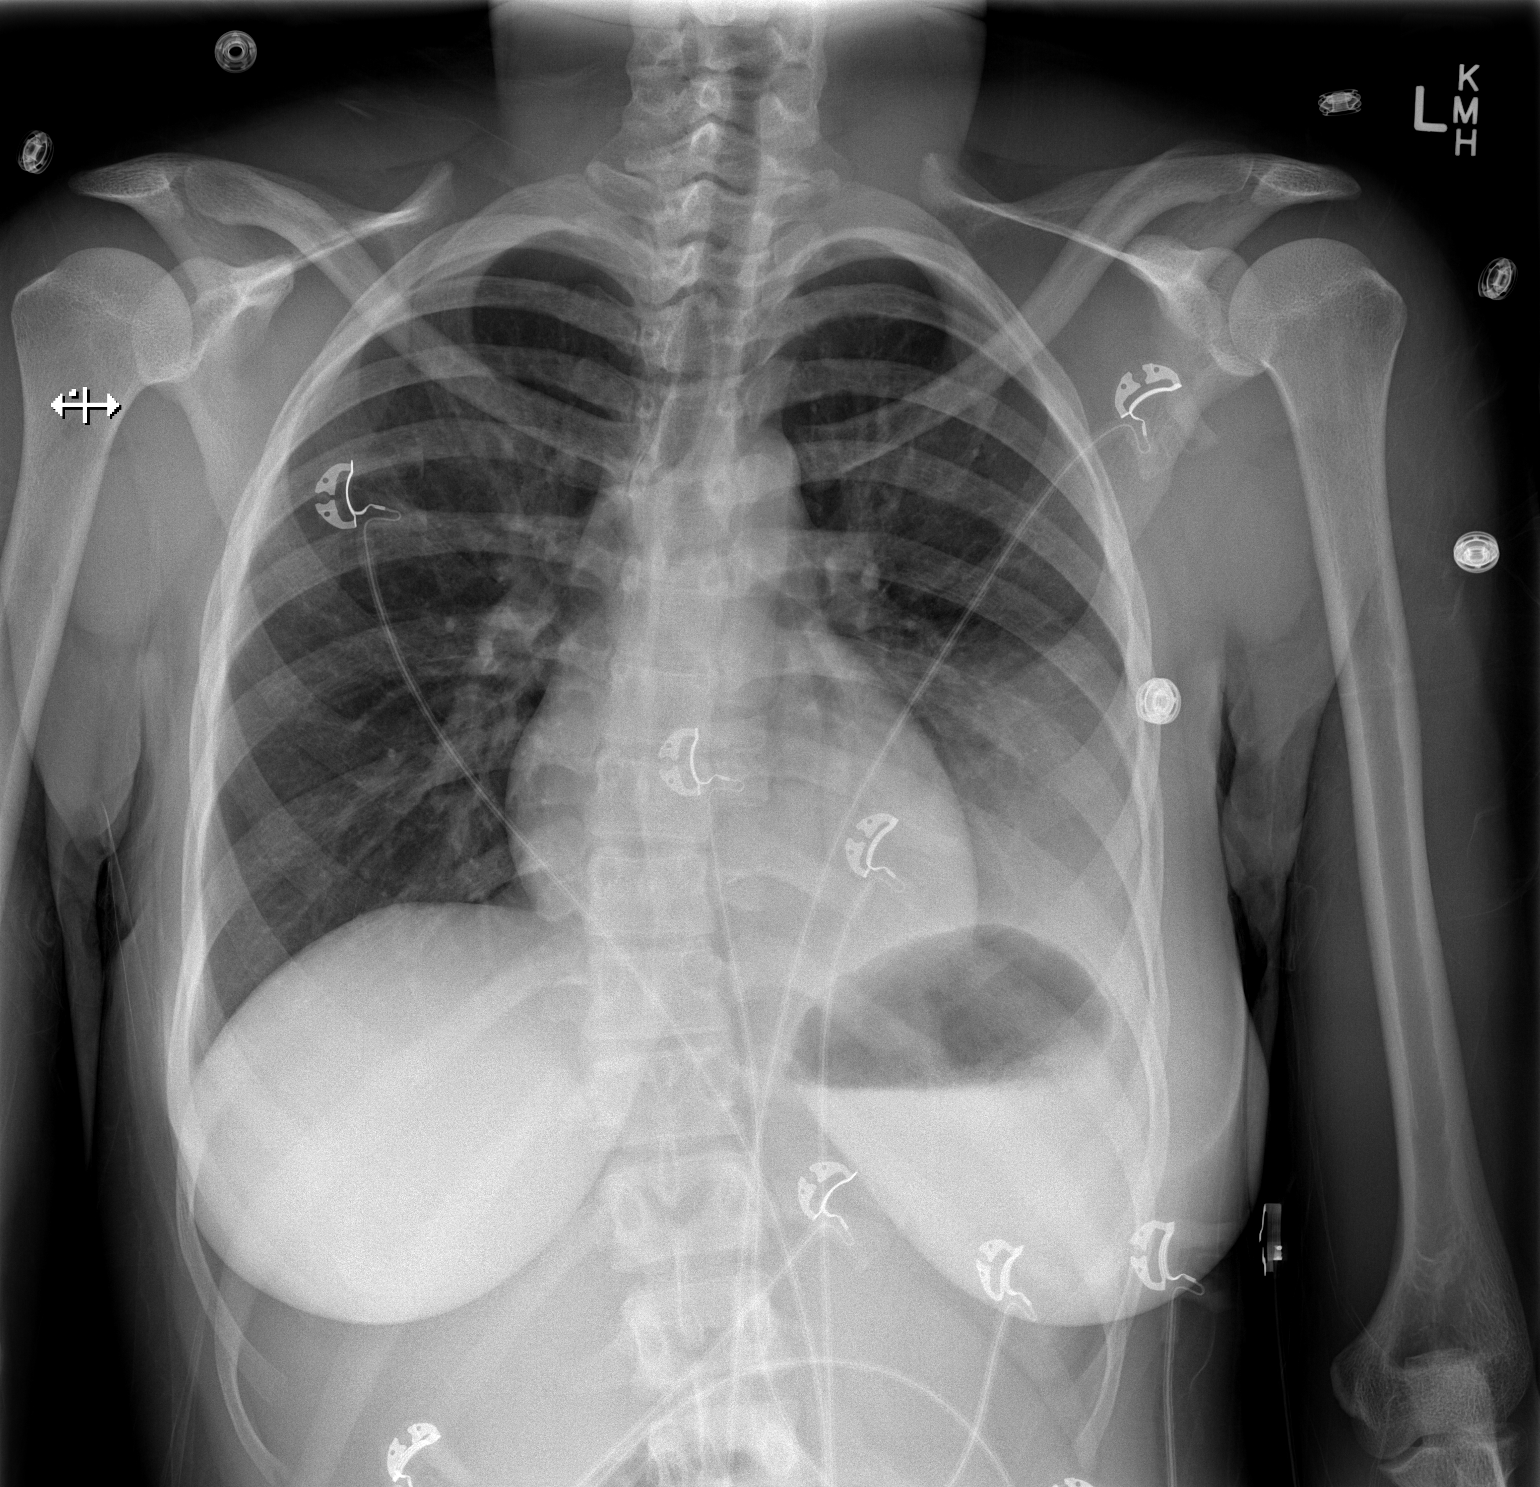

[w chest lat]
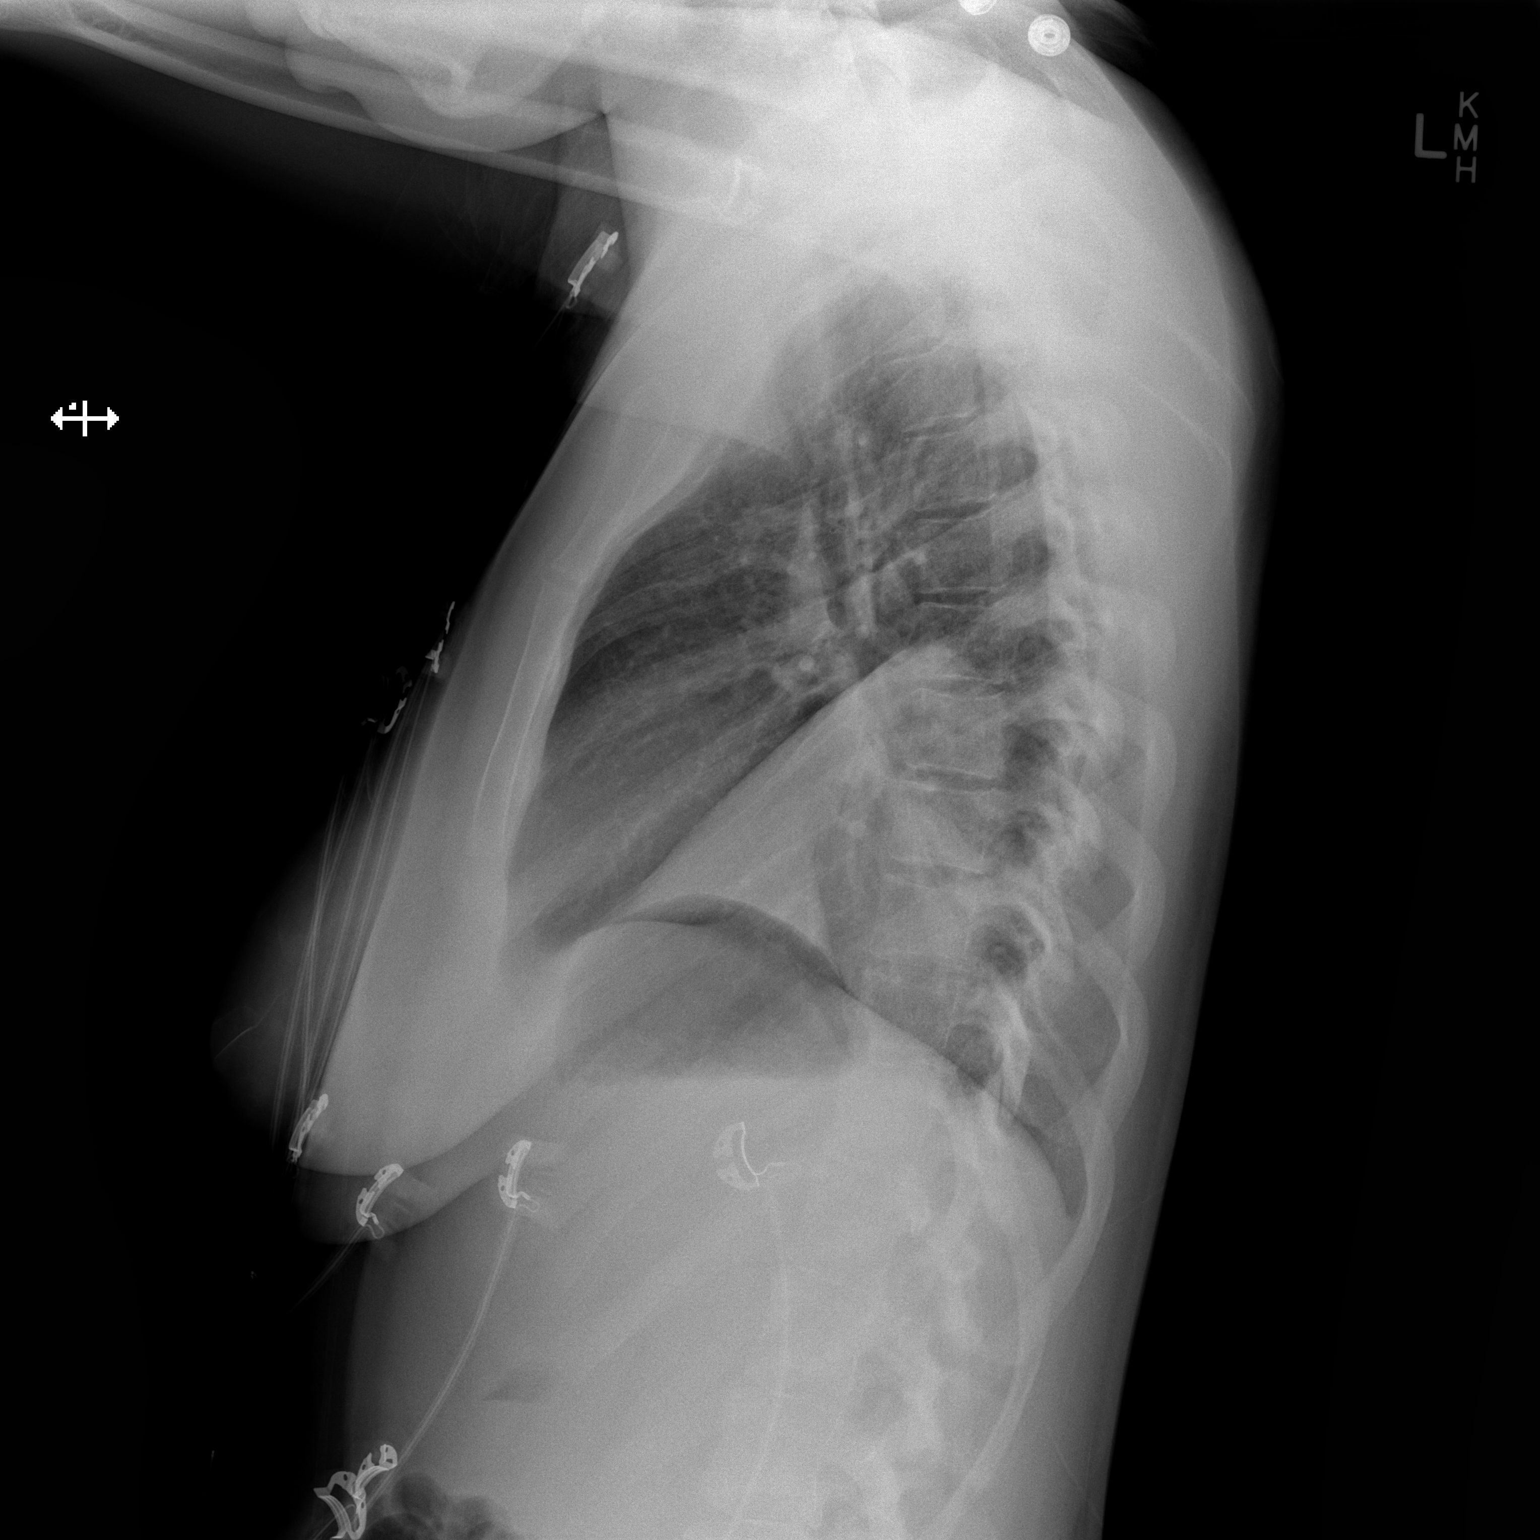

[2 of 2 positions shown; findings below may reference images not displayed]

FINDINGS: Almost complete consolidation of the left lower lobe. Recommend
follow-up until clearance to exclude central obstructing lesion.

Central pulmonary vascular prominence.

Heart size within normal limits.

Scoliosis convex right.

No gross pneumothorax.
IMPRESSION: Almost complete consolidation left lung consistent with pneumonia.
Recommend followup until clearance.

Scoliosis convex right.

Central pulmonary vascular prominence.

These results were called by telephone at the time of interpretation
on 05/18/2013 at [DATE] to Dr. HANS OVE TENGLER , who verbally
acknowledged these results.

## 2019-01-22 ENCOUNTER — Ambulatory Visit (HOSPITAL_COMMUNITY)
Admission: EM | Admit: 2019-01-22 | Discharge: 2019-01-22 | Disposition: A | Discharge status: Home / Self Care | Payer: BC Managed Care – PPO | Attending: Emergency Medicine | Admitting: Emergency Medicine

## 2019-01-22 ENCOUNTER — Other Ambulatory Visit: Payer: Self-pay

## 2019-01-22 ENCOUNTER — Encounter (HOSPITAL_COMMUNITY): Payer: Self-pay

## 2019-01-22 DIAGNOSIS — N939 Abnormal uterine and vaginal bleeding, unspecified: Secondary | ICD-10-CM | POA: Diagnosis not present

## 2019-01-22 DIAGNOSIS — Z3202 Encounter for pregnancy test, result negative: Secondary | ICD-10-CM

## 2019-01-22 DIAGNOSIS — R109 Unspecified abdominal pain: Secondary | ICD-10-CM | POA: Diagnosis not present

## 2019-01-22 LAB — POCT URINALYSIS DIP (DEVICE)
Bilirubin Urine: NEGATIVE
Glucose, UA: NEGATIVE mg/dL
Ketones, ur: NEGATIVE mg/dL
Leukocytes,Ua: NEGATIVE
Nitrite: NEGATIVE
Protein, ur: NEGATIVE mg/dL
Specific Gravity, Urine: 1.025 (ref 1.005–1.030)
Urobilinogen, UA: 0.2 mg/dL (ref 0.0–1.0)
pH: 6.5 (ref 5.0–8.0)

## 2019-01-22 LAB — CBC
HCT: 39 % (ref 36.0–46.0)
Hemoglobin: 13.3 g/dL (ref 12.0–15.0)
MCH: 32.4 pg (ref 26.0–34.0)
MCHC: 34.1 g/dL (ref 30.0–36.0)
MCV: 95.1 fL (ref 80.0–100.0)
Platelets: 301 10*3/uL (ref 150–400)
RBC: 4.1 MIL/uL (ref 3.87–5.11)
RDW: 13.3 % (ref 11.5–15.5)
WBC: 8.1 10*3/uL (ref 4.0–10.5)
nRBC: 0 % (ref 0.0–0.2)

## 2019-01-22 LAB — POCT PREGNANCY, URINE: Preg Test, Ur: NEGATIVE

## 2019-01-22 MED ORDER — MEGESTROL ACETATE 40 MG PO TABS: 40.0000 mg | ORAL_TABLET | Freq: Two times a day (BID) | ORAL | 0 refills | Status: AC

## 2019-01-22 NOTE — ED Triage Notes (Signed)
Pt states she is having heavy vaginal bleeding since the beginning of this year. Pt states having lower abdominal pain and abdominal cramps.

## 2019-01-22 NOTE — ED Provider Notes (Signed)
[facility]    CSN: [id number] Arrival date & time: 01/22/19  1657      History   Chief Complaint Chief Complaint  Patient presents with  . Vaginal Bleeding  . Abdominal Pain  . Abdominal Cramping    HPI Brooke Salas is a 28 y.o. female.   Patient presents with vaginal bleeding daily x10 months.  She states she goes through 3-4 super plus tampons each day.  She also reports intermittent lower abdominal pain and cramping.  She denies fever, chills, vomiting, diarrhea, dysuria, back pain, or other symptoms.  She was on Depo shots but states she hasn't had one in >2 years.  She reports having a gynecological exam at the health department earlier this year.  The history is provided by the patient.    No past medical history on file.  Patient Active Problem List   Diagnosis Date Noted  . PNA (pneumonia) 05/18/2013  . Sinus tachycardia 05/18/2013  . Nausea & vomiting 05/18/2013  . Hypokalemia 05/18/2013    No past surgical history on file.  OB History   No obstetric history on file.      Home Medications    Prior to Admission medications   Medication Sig Start Date End Date Taking? Authorizing Provider  levofloxacin (LEVAQUIN) 750 MG tablet Take 1 tablet (750 mg total) by mouth daily. 05/20/13   Esperanza Sheets, MD  medroxyPROGESTERone (DEPO-PROVERA) 150 MG/ML injection Inject 150 mg into the muscle every 3 (three) months.    [provider]  megestrol (MEGACE) 40 MG tablet Take 1 tablet (40 mg total) by mouth 2 (two) times daily for 10 days. 01/22/19 02/01/19  Mickie Bail, NP    Family History No family history on file.  Social History Social History   Tobacco Use  . Smoking status: Current Every Day Smoker    Packs/day: 1.00    Types: Cigarettes  . Smokeless tobacco: Never Used  Substance Use Topics  . Alcohol use: No  . Drug use: Not on file     Allergies   Patient has no known allergies.   Review of Systems Review of  Systems  Constitutional: Negative for chills and fever.  HENT: Negative for ear pain and sore throat.   Eyes: Negative for pain and visual disturbance.  Respiratory: Negative for cough and shortness of breath.   Cardiovascular: Negative for chest pain and palpitations.  Gastrointestinal: Positive for abdominal pain. Negative for diarrhea, nausea and vomiting.  Genitourinary: Positive for vaginal bleeding. Negative for dysuria and hematuria.  Musculoskeletal: Negative for arthralgias and back pain.  Skin: Negative for color change and rash.  Neurological: Negative for seizures and syncope.  All other systems reviewed and are negative.    Physical Exam Triage Vital Signs ED Triage Vitals  Enc Vitals Group     BP 01/22/19 1734 (!) 130/91     Pulse Rate 01/22/19 1734 79     Resp 01/22/19 1734 16     Temp 01/22/19 1734 98.5 F (36.9 C)     Temp Source 01/22/19 1734 Oral     SpO2 01/22/19 1734 100 %     Weight --      Height --      Head Circumference --      Peak Flow --      Pain Score 01/22/19 1731 0     Pain Loc --      Pain Edu? --      Excl. in  GC? --    No data found.  Updated Vital Signs BP (!) 130/91 (BP Location: Right Arm)   Pulse 79   Temp 98.5 F (36.9 C) (Oral)   Resp 16   SpO2 100%   Visual Acuity Right Eye Distance:   Left Eye Distance:   Bilateral Distance:    Right Eye Near:   Left Eye Near:    Bilateral Near:     Physical Exam Vitals signs and nursing note reviewed.  Constitutional:      General: She is not in acute distress.    Appearance: She is well-developed. She is not ill-appearing.  HENT:     Head: Normocephalic and atraumatic.     Mouth/Throat:     Mouth: Mucous membranes are moist.  Eyes:     Conjunctiva/sclera: Conjunctivae normal.  Neck:     Musculoskeletal: Neck supple.  Cardiovascular:     Rate and Rhythm: Normal rate and regular rhythm.     Heart sounds: No murmur.  Pulmonary:     Effort: Pulmonary effort is normal. No  respiratory distress.     Breath sounds: Normal breath sounds.  Abdominal:     General: Bowel sounds are normal. There is no distension.     Palpations: Abdomen is soft.     Tenderness: There is no abdominal tenderness. There is no right CVA tenderness, left CVA tenderness, guarding or rebound.  Skin:    General: Skin is warm and dry.     Coloration: Skin is not pale.  Neurological:     General: No focal deficit present.     Mental Status: She is alert and oriented to person, place, and time.      UC Treatments / Results  Labs (all labs ordered are listed, but only abnormal results are displayed) Labs Reviewed  POCT URINALYSIS DIP (DEVICE) - Abnormal; Notable for the following components:      Result Value   Hgb urine dipstick MODERATE (*)    All other components within normal limits  CBC  POCT PREGNANCY, URINE    EKG   Radiology No results found.  Procedures Procedures (including critical care time)  Medications Ordered in UC Medications - No data to display  Initial Impression / Assessment and Plan / UC Course  I have reviewed the triage vital signs and the nursing notes.  Pertinent labs & imaging results that were available during my care of the patient were reviewed by me and considered in my medical decision making (see chart for details).    Vaginal bleeding, abdominal pain.  Urine pregnancy negative.  CBC normal.  Patient is well-appearing.  Treating with megestrol.  Instructed patient to make an appointment with an OB/GYN as soon as possible.  Discussed with her that her vaginal bleeding may begin again when she stops taking the megestol.  Instructed patient to go to the emergency department if she has severe abdominal pain or increased vaginal bleeding.  Patient agrees to plan of care.     Final Clinical Impressions(s) / UC Diagnoses   Final diagnoses:  Vaginal bleeding     Discharge Instructions     Take the prescribed megestrol as directed.  Call  the OB/GYN listed below or the Health Department to schedule an appointment as soon as possible.  Your vaginal bleeding may begin again when you stop taking this medication.    We will call you if the results of your blood test are abnormal.  Your urine pregnancy test is negative.  Go to the emergency department if you have severe abdominal pain or increased vaginal bleeding.        ED Prescriptions    Medication Sig Dispense Auth. Provider   megestrol (MEGACE) 40 MG tablet Take 1 tablet (40 mg total) by mouth 2 (two) times daily for 10 days. 20 tablet Sharion Balloon, NP     PDMP not reviewed this encounter.   Sharion Balloon, NP 01/22/19 [phone]

## 2019-01-22 NOTE — Discharge Instructions (Addendum)
Take the prescribed megestrol as directed.  Call the OB/GYN listed below or the Health Department to schedule an appointment as soon as possible.  Your vaginal bleeding may begin again when you stop taking this medication.    We will call you if the results of your blood test are abnormal.  Your urine pregnancy test is negative.    Go to the emergency department if you have severe abdominal pain or increased vaginal bleeding.

## 2019-01-25 ENCOUNTER — Telehealth: Payer: Self-pay | Admitting: Emergency Medicine

## 2019-01-25 NOTE — Telephone Encounter (Signed)
Normal, pt contacted and made aware, all questions answered.

## 2019-10-22 ENCOUNTER — Other Ambulatory Visit: Payer: Self-pay

## 2019-10-22 ENCOUNTER — Emergency Department (HOSPITAL_COMMUNITY)
Admission: EM | Admit: 2019-10-22 | Discharge: 2019-10-22 | Disposition: A | Discharge status: Home / Self Care | Payer: BC Managed Care – PPO | Attending: Emergency Medicine | Admitting: Emergency Medicine

## 2019-10-22 ENCOUNTER — Encounter (HOSPITAL_COMMUNITY): Payer: Self-pay | Admitting: Emergency Medicine

## 2019-10-22 DIAGNOSIS — Z5321 Procedure and treatment not carried out due to patient leaving prior to being seen by health care provider: Secondary | ICD-10-CM | POA: Insufficient documentation

## 2019-10-22 DIAGNOSIS — H0289 Other specified disorders of eyelid: Secondary | ICD-10-CM | POA: Insufficient documentation

## 2019-10-22 MED ORDER — FLUORESCEIN SODIUM 1 MG OP STRP: 1.0000 | ORAL_STRIP | Freq: Once | OPHTHALMIC | Status: DC

## 2019-10-22 MED ORDER — TETRACAINE HCL 0.5 % OP SOLN: 2.0000 [drp] | Freq: Once | OPHTHALMIC | Status: DC

## 2019-10-22 NOTE — ED Notes (Signed)
Called pt for vitals no answer 

## 2019-10-22 NOTE — ED Triage Notes (Addendum)
Pt presents to ED POV. Pt states that she put her contact in her R eye this morning and it got stuck in wrong position under top eye lid.

## 2019-10-22 NOTE — ED Notes (Signed)
Called for repeat vitals no answer and no answer outside

## 2020-10-29 ENCOUNTER — Encounter (HOSPITAL_COMMUNITY): Payer: Self-pay

## 2020-10-29 ENCOUNTER — Emergency Department (HOSPITAL_COMMUNITY): Payer: Medicaid Other

## 2020-10-29 ENCOUNTER — Emergency Department (HOSPITAL_COMMUNITY)
Admission: EM | Admit: 2020-10-29 | Discharge: 2020-10-29 | Disposition: A | Discharge status: Home / Self Care | Payer: Medicaid Other | Attending: Emergency Medicine | Admitting: Emergency Medicine

## 2020-10-29 DIAGNOSIS — F17210 Nicotine dependence, cigarettes, uncomplicated: Secondary | ICD-10-CM | POA: Insufficient documentation

## 2020-10-29 DIAGNOSIS — R102 Pelvic and perineal pain: Secondary | ICD-10-CM | POA: Insufficient documentation

## 2020-10-29 DIAGNOSIS — L02215 Cutaneous abscess of perineum: Secondary | ICD-10-CM | POA: Insufficient documentation

## 2020-10-29 DIAGNOSIS — L732 Hidradenitis suppurativa: Secondary | ICD-10-CM

## 2020-10-29 DIAGNOSIS — L0291 Cutaneous abscess, unspecified: Secondary | ICD-10-CM

## 2020-10-29 LAB — COMPREHENSIVE METABOLIC PANEL
ALT: 13 U/L (ref 0–44)
AST: 19 U/L (ref 15–41)
Albumin: 4.2 g/dL (ref 3.5–5.0)
Alkaline Phosphatase: 56 U/L (ref 38–126)
Anion gap: 5 (ref 5–15)
BUN: 13 mg/dL (ref 6–20)
CO2: 27 mmol/L (ref 22–32)
Calcium: 9 mg/dL (ref 8.9–10.3)
Chloride: 106 mmol/L (ref 98–111)
Creatinine, Ser: 0.81 mg/dL (ref 0.44–1.00)
GFR, Estimated: >60 mL/min (ref >60)
Glucose, Bld: 52 mg/dL — ABNORMAL LOW (ref 70–99)
Potassium: 3.6 mmol/L (ref 3.5–5.1)
Sodium: 138 mmol/L (ref 135–145)
Total Bilirubin: 1 mg/dL (ref 0.3–1.2)
Total Protein: 7.4 g/dL (ref 6.5–8.1)

## 2020-10-29 LAB — CBC WITH DIFFERENTIAL/PLATELET
Abs Immature Granulocytes: 0.06 10*3/uL (ref 0.00–0.07)
Basophils Absolute: 0 10*3/uL (ref 0.0–0.1)
Basophils Relative: 0 %
Eosinophils Absolute: 0.1 10*3/uL (ref 0.0–0.5)
Eosinophils Relative: 1 %
HCT: 39.9 % (ref 36.0–46.0)
Hemoglobin: 13.4 g/dL (ref 12.0–15.0)
Immature Granulocytes: 1 %
Lymphocytes Relative: 16 %
Lymphs Abs: 1.9 10*3/uL (ref 0.7–4.0)
MCH: 32.8 pg (ref 26.0–34.0)
MCHC: 33.6 g/dL (ref 30.0–36.0)
MCV: 97.8 fL (ref 80.0–100.0)
Monocytes Absolute: 1.1 10*3/uL — ABNORMAL HIGH (ref 0.1–1.0)
Monocytes Relative: 9 %
Neutro Abs: 8.5 10*3/uL — ABNORMAL HIGH (ref 1.7–7.7)
Neutrophils Relative %: 73 %
Platelets: 259 10*3/uL (ref 150–400)
RBC: 4.08 MIL/uL (ref 3.87–5.11)
RDW: 14 % (ref 11.5–15.5)
WBC: 11.6 10*3/uL — ABNORMAL HIGH (ref 4.0–10.5)
nRBC: 0 % (ref 0.0–0.2)

## 2020-10-29 LAB — I-STAT BETA HCG BLOOD, ED (MC, WL, AP ONLY): I-stat hCG, quantitative: <5 m[IU]/mL (ref <5)

## 2020-10-29 MED ORDER — SULFAMETHOXAZOLE-TRIMETHOPRIM 800-160 MG PO TABS
1.0000 | ORAL_TABLET | Freq: Once | ORAL | Status: AC
  Administered 2020-10-29: 1 via ORAL
  Filled 2020-10-29: qty 1

## 2020-10-29 MED ORDER — OXYCODONE HCL 5 MG PO TABS
5.0000 mg | ORAL_TABLET | Freq: Once | ORAL | Status: AC
  Administered 2020-10-29: 5 mg via ORAL
  Filled 2020-10-29: qty 1

## 2020-10-29 MED ORDER — IOHEXOL 350 MG/ML SOLN
80.0000 mL | Freq: Once | INTRAVENOUS | Status: AC | PRN
  Administered 2020-10-29: 80 mL via INTRAVENOUS

## 2020-10-29 MED ORDER — LIDOCAINE-EPINEPHRINE (PF) 2 %-1:200000 IJ SOLN
10.0000 mL | Freq: Once | INTRAMUSCULAR | Status: AC
  Administered 2020-10-29: 10 mL
  Filled 2020-10-29: qty 20

## 2020-10-29 MED ORDER — SULFAMETHOXAZOLE-TRIMETHOPRIM 800-160 MG PO TABS: 1.0000 | ORAL_TABLET | Freq: Two times a day (BID) | ORAL | 0 refills | Status: AC

## 2020-10-29 MED ORDER — OXYCODONE HCL 5 MG PO TABS: 5.0000 mg | ORAL_TABLET | Freq: Four times a day (QID) | ORAL | 0 refills | Status: DC | PRN

## 2020-10-29 NOTE — ED Provider Notes (Signed)
Emergency Medicine Provider Triage Evaluation Note  Brooke Salas , a 30 y.o. female  was evaluated in triage.  Pt complains of 2 abscesses to the perineum.  States her symptoms started about 4 days ago and have been worsening.  Reports drainage from one of the abscesses that started earlier today.  Denies any fevers, chills, nausea, vomiting, dysuria.  No history of previous abscesses in the region.  Physical Exam  BP 114/77 (BP Location: Left Arm)   Pulse 92   Temp 97.9 F (36.6 C) (Oral)   Ht 5\' 3"  (1.6 m)   Wt 61.2 kg   SpO2 98%   BMI 23.91 kg/m  Gen:   Awake, no distress   Resp:  Normal effort  MSK:   Moves extremities without difficulty  Other:    Medical Decision Making  Medically screening exam initiated at 6:00 PM.  Appropriate orders placed.  FLETA BORGESON was informed that the remainder of the evaluation will be completed by another provider, this initial triage assessment does not replace that evaluation, and the importance of remaining in the ED until their evaluation is complete.   Magdalene Molly, PA-C 10/29/20 1802    10/31/20, DO 10/30/20 1115

## 2020-10-29 NOTE — ED Provider Notes (Signed)
[facility]-EMERGENCY DEPT Provider Note   CSN: [id number] Arrival date & time: 10/29/20  1736     History Chief Complaint  Patient presents with   Abscess    Perineal     AVAIAH STEMPEL is a 30 y.o. female.  HPI Patient is a 30 year old female who presents to the emergency department due to 2 regions of pain and swelling along the perineum.  States her symptoms started about 4 days ago and have been worsening.  States that one of the regions began draining purulent discharge earlier today.  Denies fevers, chills, nausea, vomiting, dysuria.  No previous history of abscesses in the region.    History reviewed. No pertinent past medical history.  Patient Active Problem List   Diagnosis Date Noted   PNA (pneumonia) 05/18/2013   Sinus tachycardia 05/18/2013   Nausea & vomiting 05/18/2013   Hypokalemia 05/18/2013    History reviewed. No pertinent surgical history.   OB History   No obstetric history on file.     History reviewed. No pertinent family history.  Social History   Tobacco Use   Smoking status: Every Day    Packs/day: 1.00    Types: Cigarettes   Smokeless tobacco: Never  Substance Use Topics   Alcohol use: No    Home Medications Prior to Admission medications   Medication Sig Start Date End Date Taking? Authorizing Provider  levofloxacin (LEVAQUIN) 750 MG tablet Take 1 tablet (750 mg total) by mouth daily. 05/20/13   Esperanza Sheets, MD  medroxyPROGESTERone (DEPO-PROVERA) 150 MG/ML injection Inject 150 mg into the muscle every 3 (three) months.    [provider]    Allergies    Patient has no known allergies.  Review of Systems   Review of Systems  Constitutional:  Negative for chills and fever.  Gastrointestinal:  Negative for nausea and vomiting.  Genitourinary:  Positive for vaginal pain. Negative for dysuria and vaginal discharge.  Musculoskeletal:  Positive for myalgias.  Skin:  Positive for color change and  rash.   Physical Exam Updated Vital Signs BP 114/77 (BP Location: Left Arm)   Pulse 92   Temp 97.9 F (36.6 C) (Oral)   Ht 5\' 3"  (1.6 m)   Wt 61.2 kg   SpO2 98%   BMI 23.91 kg/m   Physical Exam Vitals and nursing note reviewed.  Constitutional:      General: She is not in acute distress.    Appearance: She is well-developed.  HENT:     Head: Normocephalic and atraumatic.     Right Ear: External ear normal.     Left Ear: External ear normal.  Eyes:     General: No scleral icterus.       Right eye: No discharge.        Left eye: No discharge.     Conjunctiva/sclera: Conjunctivae normal.  Neck:     Trachea: No tracheal deviation.  Cardiovascular:     Rate and Rhythm: Normal rate.  Pulmonary:     Effort: Pulmonary effort is normal. No respiratory distress.     Breath sounds: No stridor.  Abdominal:     General: There is no distension.  Genitourinary:    Comments: Female nursing chaperone present.  5 cm fluctuant erythematous moderately tender region noted along the left proximal medial thigh just lateral to the labia majora.  Additional 4 cm indurated moderately tender region noted at the 9 o'clock position of the perirectal region. Musculoskeletal:  General: No swelling or deformity.     Cervical back: Neck supple.  Skin:    General: Skin is warm and dry.     Findings: No rash.  Neurological:     Mental Status: She is alert.     Cranial Nerves: Cranial nerve deficit: no gross deficits.   ED Results / Procedures / Treatments   Labs (all labs ordered are listed, but only abnormal results are displayed) Labs Reviewed - No data to display  EKG None  Radiology No results found.  Procedures Procedures   Medications Ordered in ED Medications  lidocaine-EPINEPHrine (XYLOCAINE W/EPI) 2 %-1:200000 (PF) injection 10 mL (has no administration in time range)   ED Course  I have reviewed the triage vital signs and the nursing notes.  Pertinent labs & imaging  results that were available during my care of the patient were reviewed by me and considered in my medical decision making (see chart for details).    MDM Rules/Calculators/A&P                          Patient is a 30 year old female who presents to the emergency department with 2 worsening abscesses along the genitourinary region.  Physical exam significant for fluctuant abscess along the left medial proximal thigh just lateral to the labia majora.  Additional indurated abscess noted at the 9 o'clock position in the perirectal region.  Patient pending labs as well as a CT scan of the pelvic region.  It is the end of my shift and patient care is being transferred to [facility] for final disposition.  Final Clinical Impression(s) / ED Diagnoses Final diagnoses:  Abscess   Rx / DC Orders ED Discharge Orders     None        Placido Sou, PA-C 10/29/20 1909    Franne Forts, DO 10/30/20 1121

## 2020-10-29 NOTE — ED Provider Notes (Addendum)
9:33 PM Signout from [facility] at shift change, pending CT.   Discussed CT findings at bedside with and seen by Dr. Wallace Cullens.   L perineal abscess I&D by Dr. Wallace Cullens. R perineal abscess I&D by myself.   Plan: Home with warm soaks, Bactrim, #4 oxycodone 5 mg.  Patient given a dose prior to discharge.  Surgery referral given.  The patient was urged to return to the Emergency Department urgently with worsening pain, swelling, expanding erythema especially if it streaks away from the affected area, fever, or if they have any other concerns.   The patient was urged to return to the Emergency Department or go to their PCP in 48 hours for wound recheck if the area is not significantly improved.  The patient verbalized understanding and stated agreement with this plan.   Marland Kitchen.Incision and Drainage  Date/Time: 10/29/2020 9:36 PM Performed by: Renne Crigler, PA-C Authorized by: Renne Crigler, PA-C   Consent:    Consent obtained:  Verbal   Consent given by:  Patient   Risks discussed:  Pain   Alternatives discussed:  No treatment Universal protocol:    Patient identity confirmed:  Verbally with patient Location:    Type:  Abscess   Size:  1cm   Location:  Anogenital   Anogenital location:  Perineum (R sided) Procedure type:    Complexity:  Simple Procedure details:    Incision types:  Stab incision   Wound management:  Probed and deloculated   Drainage:  Bloody and purulent   Drainage amount:  Scant   Wound treatment:  Wound left open   Packing materials:  None Post-procedure details:    Procedure completion:  Corliss Parish, PA-C 10/29/20 2137    Franne Forts, DO 10/30/20 1122

## 2020-10-29 NOTE — Discharge Instructions (Addendum)
Please read and follow all provided instructions.  Your diagnoses today include:  1. Abscess   2. Hidradenitis suppurativa     Tests performed today include: Vital signs. See below for your results today.   Medications prescribed:  Bactrim (trimethoprim/sulfamethoxazole) - antibiotic  You have been prescribed an antibiotic medicine: take the entire course of medicine even if you are feeling better. Stopping early can cause the antibiotic not to work.  Oxycodone - narcotic pain medication  DO NOT drive or perform any activities that require you to be awake and alert because this medicine can make you drowsy.   Take any prescribed medications only as directed.   Home care instructions:  Follow any educational materials contained in this packet  Follow-up instructions: Return to the Emergency Department in 48 hours for a recheck if your symptoms are not significantly improved.  Please follow-up with your primary care provider in the next 1 week for further evaluation of your symptoms.   Return instructions:  Return to the Emergency Department if you have: Fever Worsening symptoms Worsening pain Worsening swelling Redness of the skin that moves away from the affected area, especially if it streaks away from the affected area  Any other emergent concerns  Your vital signs today were: BP 114/77 (BP Location: Left Arm)   Pulse 92   Temp 97.9 F (36.6 C) (Oral)   Ht 5\' 3"  (1.6 m)   Wt 61.2 kg   SpO2 98%   BMI 23.91 kg/m  If your blood pressure (BP) was elevated above 135/85 this visit, please have this repeated by your doctor within one month. --------------

## 2020-10-29 NOTE — ED Notes (Signed)
Pt given graham crackers and orange juice for low glucose level.

## 2020-10-29 NOTE — ED Triage Notes (Signed)
Pt presents to the ED for possible abscess x2 to the perineum. She reports her sx began four days ago. She denies past hx of same. She denies fever, chills, nausea, or vomiting.

## 2020-10-29 NOTE — ED Notes (Signed)
PA-C at the bedside.  ?

## 2020-10-30 NOTE — ED Provider Notes (Signed)
..  Incision and Drainage  Date/Time: 10/30/2020 11:57 AM Performed by: Franne Forts, DO Authorized by: Franne Forts, DO   Consent:    Consent obtained:  Verbal   Consent given by:  Patient   Risks, benefits, and alternatives were discussed: yes     Risks discussed:  Bleeding, pain, infection and incomplete drainage Universal protocol:    Immediately prior to procedure, a time out was called: yes     Patient identity confirmed:  Verbally with patient, hospital-assigned identification number and arm band Location:    Type:  Abscess   Location:  Anogenital Sedation:    Sedation type:  None Anesthesia:    Anesthesia method:  Local infiltration Procedure details:    Incision types:  Single straight   Drainage:  Purulent   Drainage amount:  Copious   Wound treatment:  Wound left open   Packing materials:  None Post-procedure details:    Procedure completion:  Tolerated well, no immediate complications Comments:     Incision and drainage performed at bedside of two perineal abscesses. One completed by myself and the other by the APP.     Franne Forts, DO 10/30/20 1159

## 2022-03-24 ENCOUNTER — Other Ambulatory Visit: Payer: Self-pay

## 2022-03-24 ENCOUNTER — Emergency Department (HOSPITAL_COMMUNITY): Payer: Commercial Managed Care - HMO

## 2022-03-24 ENCOUNTER — Emergency Department (HOSPITAL_COMMUNITY)
Admission: EM | Admit: 2022-03-24 | Discharge: 2022-03-24 | Disposition: A | Discharge status: Home / Self Care | Payer: Commercial Managed Care - HMO | Attending: Emergency Medicine | Admitting: Emergency Medicine

## 2022-03-24 DIAGNOSIS — R1031 Right lower quadrant pain: Secondary | ICD-10-CM | POA: Insufficient documentation

## 2022-03-24 DIAGNOSIS — R109 Unspecified abdominal pain: Secondary | ICD-10-CM

## 2022-03-24 LAB — COMPREHENSIVE METABOLIC PANEL
ALT: 13 U/L (ref 0–44)
AST: 16 U/L (ref 15–41)
Albumin: 3.8 g/dL (ref 3.5–5.0)
Alkaline Phosphatase: 57 U/L (ref 38–126)
Anion gap: 10 (ref 5–15)
BUN: 9 mg/dL (ref 6–20)
CO2: 25 mmol/L (ref 22–32)
Calcium: 9.1 mg/dL (ref 8.9–10.3)
Chloride: 101 mmol/L (ref 98–111)
Creatinine, Ser: 0.93 mg/dL (ref 0.44–1.00)
GFR, Estimated: >60 mL/min (ref >60)
Glucose, Bld: 111 mg/dL — ABNORMAL HIGH (ref 70–99)
Potassium: 4 mmol/L (ref 3.5–5.1)
Sodium: 136 mmol/L (ref 135–145)
Total Bilirubin: 0.6 mg/dL (ref 0.3–1.2)
Total Protein: 6.9 g/dL (ref 6.5–8.1)

## 2022-03-24 LAB — CBC
HCT: 39.9 % (ref 36.0–46.0)
Hemoglobin: 13.5 g/dL (ref 12.0–15.0)
MCH: 33.3 pg (ref 26.0–34.0)
MCHC: 33.8 g/dL (ref 30.0–36.0)
MCV: 98.5 fL (ref 80.0–100.0)
Platelets: 287 10*3/uL (ref 150–400)
RBC: 4.05 MIL/uL (ref 3.87–5.11)
RDW: 14.3 % (ref 11.5–15.5)
WBC: 14.8 10*3/uL — ABNORMAL HIGH (ref 4.0–10.5)
nRBC: 0 % (ref 0.0–0.2)

## 2022-03-24 LAB — LIPASE, BLOOD: Lipase: 39 U/L (ref 11–51)

## 2022-03-24 LAB — URINALYSIS, ROUTINE W REFLEX MICROSCOPIC
Bilirubin Urine: NEGATIVE
Glucose, UA: NEGATIVE mg/dL
Hgb urine dipstick: NEGATIVE
Ketones, ur: NEGATIVE mg/dL
Leukocytes,Ua: NEGATIVE
Nitrite: NEGATIVE
Protein, ur: NEGATIVE mg/dL
Specific Gravity, Urine: 1.02 (ref 1.005–1.030)
pH: 7 (ref 5.0–8.0)

## 2022-03-24 LAB — I-STAT BETA HCG BLOOD, ED (MC, WL, AP ONLY): I-stat hCG, quantitative: <5 m[IU]/mL (ref <5)

## 2022-03-24 MED ORDER — ONDANSETRON HCL 4 MG/2ML IJ SOLN
4.0000 mg | Freq: Once | INTRAMUSCULAR | Status: AC
  Administered 2022-03-24: 4 mg via INTRAVENOUS
  Filled 2022-03-24: qty 2

## 2022-03-24 MED ORDER — SODIUM CHLORIDE 0.9 % IV SOLN: Freq: Once | INTRAVENOUS | Status: AC

## 2022-03-24 MED ORDER — SODIUM CHLORIDE 0.9 % IV BOLUS
1000.0000 mL | Freq: Once | INTRAVENOUS | Status: AC
  Administered 2022-03-24: 1000 mL via INTRAVENOUS

## 2022-03-24 MED ORDER — IOHEXOL 350 MG/ML SOLN
75.0000 mL | Freq: Once | INTRAVENOUS | Status: AC | PRN
  Administered 2022-03-24: 75 mL via INTRAVENOUS

## 2022-03-24 MED ORDER — IOHEXOL 350 MG/ML SOLN: 75.0000 mL | Freq: Once | INTRAVENOUS | Status: DC | PRN

## 2022-03-24 MED ORDER — HYDROMORPHONE HCL 1 MG/ML IJ SOLN
0.5000 mg | Freq: Once | INTRAMUSCULAR | Status: AC
  Administered 2022-03-24: 0.5 mg via INTRAVENOUS
  Filled 2022-03-24: qty 1

## 2022-03-24 MED ORDER — MORPHINE SULFATE (PF) 4 MG/ML IV SOLN
4.0000 mg | Freq: Once | INTRAVENOUS | Status: AC
  Administered 2022-03-24: 4 mg via INTRAVENOUS
  Filled 2022-03-24: qty 1

## 2022-03-24 MED ORDER — NAPROXEN 375 MG PO TABS: 375.0000 mg | ORAL_TABLET | Freq: Two times a day (BID) | ORAL | 0 refills | Status: DC

## 2022-03-24 MED ORDER — OXYCODONE-ACETAMINOPHEN 5-325 MG PO TABS
1.0000 | ORAL_TABLET | Freq: Once | ORAL | Status: AC
  Administered 2022-03-24: 1 via ORAL
  Filled 2022-03-24: qty 1

## 2022-03-24 MED ORDER — POLYETHYLENE GLYCOL 3350 17 G PO PACK: 17.0000 g | PACK | Freq: Every day | ORAL | 0 refills | Status: DC | PRN

## 2022-03-24 MED ORDER — DOCUSATE SODIUM 100 MG PO CAPS: 100.0000 mg | ORAL_CAPSULE | Freq: Two times a day (BID) | ORAL | 0 refills | Status: DC

## 2022-03-24 MED ORDER — IOHEXOL 9 MG/ML PO SOLN
ORAL | Status: AC
  Filled 2022-03-24: qty 1000

## 2022-03-24 NOTE — ED Provider Notes (Signed)
[facility] EMERGENCY DEPARTMENT Provider Note   CSN: [id number] Arrival date & time: 03/24/22  0208     History {Add pertinent medical, surgical, social history, OB history to HPI:1} Chief Complaint  Patient presents with   Abdominal Pain    Brooke Salas is a 32 y.o. female.  32 year old female with history as detailed below presents for evaluation.  Patient complains of right upper and right lower quadrant abdominal pain that began yesterday afternoon around 1 PM.  Patient reports that she had eaten a meal just before her pain began.  Patient reports continued persistent pain since onset.  Patient reports minimal to no improvement with p.o. Percocet.  She reports associated nausea without vomiting.  She denies chest pain or shortness of breath.  She denies urinary symptoms.  She denies bowel movement change.  She denies vaginal discharge or bleeding.    Patient is moaning and in clear discomfort during exam.  She is poorly cooperative with history and physical secondary to pain.    The history is provided by the patient and medical records.       Home Medications Prior to Admission medications   Medication Sig Start Date End Date Taking? Authorizing Provider  medroxyPROGESTERone (DEPO-PROVERA) 150 MG/ML injection Inject 150 mg into the muscle every 3 (three) months.    [provider]  oxyCODONE (OXY IR/ROXICODONE) 5 MG immediate release tablet Take 1 tablet (5 mg total) by mouth every 6 (six) hours as needed for severe pain. 10/29/20   Carlisle Cater, PA-C      Allergies    Patient has no known allergies.    Review of Systems   Review of Systems  All other systems reviewed and are negative.   Physical Exam Updated Vital Signs BP 108/81 (BP Location: Left Arm)   Pulse 65   Temp 98.9 F (37.2 C)   Resp 16   SpO2 100%  Physical Exam Vitals and nursing note reviewed.  Constitutional:      General: She is not in acute distress.     Appearance: Normal appearance. She is well-developed.     Comments: Alert, in obvious discomfort secondary to pain  HENT:     Head: Normocephalic and atraumatic.  Eyes:     Conjunctiva/sclera: Conjunctivae normal.     Pupils: Pupils are equal, round, and reactive to light.  Cardiovascular:     Rate and Rhythm: Normal rate and regular rhythm.     Heart sounds: Normal heart sounds.  Pulmonary:     Effort: Pulmonary effort is normal. No respiratory distress.     Breath sounds: Normal breath sounds.  Abdominal:     General: There is no distension.     Palpations: Abdomen is soft.     Tenderness: There is abdominal tenderness in the right upper quadrant and right lower quadrant. There is guarding.  Musculoskeletal:        General: No deformity. Normal range of motion.     Cervical back: Normal range of motion and neck supple.  Skin:    General: Skin is warm and dry.  Neurological:     General: No focal deficit present.     Mental Status: She is alert and oriented to person, place, and time.     ED Results / Procedures / Treatments   Labs (all labs ordered are listed, but only abnormal results are displayed) Labs Reviewed  COMPREHENSIVE METABOLIC PANEL - Abnormal; Notable for the following components:  Result Value   Glucose, Bld 111 (*)    All other components within normal limits  CBC - Abnormal; Notable for the following components:   WBC 14.8 (*)    All other components within normal limits  LIPASE, BLOOD  URINALYSIS, ROUTINE W REFLEX MICROSCOPIC  I-STAT BETA HCG BLOOD, ED ([facility], [facility], [facility] ONLY)    EKG None  Radiology US Abdomen Limited RUQ (LIVER/GB)  Result Date: 03/24/2022 CLINICAL DATA:  Right upper quadrant pain. EXAM: ULTRASOUND ABDOMEN LIMITED RIGHT UPPER QUADRANT COMPARISON:  None Available. FINDINGS: Gallbladder: No gallstones or wall thickening visualized. No sonographic Murphy sign noted by sonographer. Common bile duct: Diameter: 7 mm Liver: No focal lesion  identified. Within normal limits in parenchymal echogenicity. Portal vein is patent on color Doppler imaging with normal direction of blood flow towards the liver. Other: None. IMPRESSION: Common bile duct measures upper normal to borderline increased in diameter without evidence for intrahepatic biliary duct dilatation. No evidence for gallstones or gallbladder wall thickening. Electronically Signed   By: Misty Stanley M.D.   On: 03/24/2022 06:36    Procedures Procedures  {Document cardiac monitor, telemetry assessment procedure when appropriate:1}  Medications Ordered in ED Medications  sodium chloride 0.9 % bolus 1,000 mL (has no administration in time range)  ondansetron (ZOFRAN) injection 4 mg (has no administration in time range)  morphine (PF) 4 MG/ML injection 4 mg (has no administration in time range)  oxyCODONE-acetaminophen (PERCOCET/ROXICET) 5-325 MG per tablet 1 tablet (1 tablet Oral Given 03/24/22 0306)    ED Course/ Medical Decision Making/ A&P                           Medical Decision Making Amount and/or Complexity of Data Reviewed Radiology: ordered.  Risk Prescription drug management.    Medical Screen Complete  This patient presented to the ED with complaint of abdominal pain.  This complaint involves an extensive number of treatment options. The initial differential diagnosis includes, but is not limited to, intra-abdominal pathology such as cholecystitis, biliary colic, appendicitis, etc.  This presentation is: Acute, Self-Limited, Previously Undiagnosed, Uncertain Prognosis, Complicated, Systemic Symptoms, and Threat to Life/Bodily Function  Patient is presenting with complaint of acute onset right-sided abdominal pain.  Patient with pain that began acutely yesterday afternoon at about 1 PM.  Patient appears to be in extreme discomfort on initial evaluation.  Initial labs, right upper quadrant ultrasound, and CT abdomen pelvis is without clear etiology of  patient's pain.  CT imaging demonstrates stool in right colon and fecalization of small bowel.  Case and patient discussed with general surgery.  General surgery requests CT imaging the abdomen pelvis with both IV and p.o. contrast.  Pending imaging and disposition signed over to oncoming EDP Dr. Regenia Skeeter.   Additional history obtained: External records from outside sources obtained and reviewed including prior ED visits and prior Inpatient records.    Lab Tests:  I ordered and personally interpreted labs.  The pertinent results include: CBC, hCG, CMP, UA   Imaging Studies ordered:  I ordered imaging studies including CT abdomen pelvis I independently visualized and interpreted obtained imaging which showed fecalization of small bowel in stool in right colon I agree with the radiologist interpretation.   Cardiac Monitoring:  The patient was maintained on a cardiac monitor.  I personally viewed and interpreted the cardiac monitor which showed an underlying rhythm of: NSR   Medicines ordered:  I ordered medication including IVF, Narcotics  for pain  Reevaluation of the patient after these medicines showed that the patient: improved   Problem List / ED Course:  Abdominal pain   Reevaluation:  After the interventions noted above, I reevaluated the patient and found that they have: improved   Disposition:  After consideration of the diagnostic results and the patients response to treatment, I feel that the patent would benefit from completion of ED evaluation.    {Document critical care time when appropriate:1} {Document review of labs and clinical decision tools ie heart score, Chads2Vasc2 etc:1}  {Document your independent review of radiology images, and any outside records:1} {Document your discussion with family members, caretakers, and with consultants:1} {Document social determinants of health affecting pt's care:1} {Document your decision making why or why  not admission, treatments were needed:1} Final Clinical Impression(s) / ED Diagnoses Final diagnoses:  Generalized abdominal pain    Rx / DC Orders ED Discharge Orders     None

## 2022-03-24 NOTE — ED Provider Notes (Signed)
Care transferred to me.  Patient's second CT scan is overall unremarkable.  She still has the same amount of pain though the stool seems unchanged.  At this point, will treat with MiraLAX, Colace, and have her return if any new or worsening symptoms arise.  I doubt appendicitis or other acute intra-abdominal emergency.   Sherwood Gambler, MD 03/24/22 1745

## 2022-03-24 NOTE — ED Provider Triage Note (Addendum)
Emergency Medicine Provider Triage Evaluation Note  Brooke Salas , a 32 y.o. female  was evaluated in triage.  Pt complains of RUQ pain that began around 1pm. Has been persistent but waxed and waned. Nausea but no vomiting. No CP or SOB.  No urinary sx. No issues w Bms.   Review of Systems  Positive: Abd pain nausea Negative: Fever   Physical Exam  BP 117/78 (BP Location: Right Arm)   Pulse 75   Temp 98.9 F (37.2 C) (Oral)   Resp 14   SpO2 100%  Gen:   Awake, no distress   Resp:  Normal effort  MSK:   Moves extremities without difficulty  Other:  RUQ, epigastric TTP  Medical Decision Making  Medically screening exam initiated at 2:52 AM.  Appropriate orders placed.  ADDYSEN LOUTH was informed that the remainder of the evaluation will be completed by another provider, this initial triage assessment does not replace that evaluation, and the importance of remaining in the ED until their evaluation is complete.  Labs, RUQ [place]   Milo Solana, [place], [place] 03/24/22 0253    Tedd Sias, PA 03/24/22 [phone]

## 2022-03-24 NOTE — Consult Note (Addendum)
Consulting Physician: Nickola Major Bryauna Byrum  Referring Provider: Dr. Francia Greaves - ER Provider  Chief Complaint: Abdominal pain - right sided  Reason for Consult: Severe abdominal pain, normal CT scan   Subjective   HPI: Brooke Salas is an 32 y.o. female who is here for abdominal pain.  The pain started yesterday afternoon.  Started after eating.  She has no issues with abdominal pain after eating in the past and she feels like she can eat anything including fatty meals without issue.  She has never had abdominal pain issues like this in the past.  The pain is all the way up and down her right side.  She says the pain has improved some after pain medication here in the emergency room.  Last bowel movement was on Friday.  No past medical history on file.  No past surgical history on file.  No family history on file.  Social:  reports that she has been smoking cigarettes. She has been smoking an average of 1 pack per day. She has never used smokeless tobacco. She reports that she does not drink alcohol. No history on file for drug use.  Allergies: No Known Allergies  Medications: Current Outpatient Medications  Medication Instructions   ibuprofen (ADVIL) 600-800 mg, Oral, As needed    ROS - all of the below systems have been reviewed with the patient and positives are indicated with bold text General: chills, fever or night sweats Eyes: blurry vision or double vision ENT: epistaxis or sore throat Allergy/Immunology: itchy/watery eyes or nasal congestion Hematologic/Lymphatic: bleeding problems, blood clots or swollen lymph nodes Endocrine: temperature intolerance or unexpected weight changes Breast: new or changing breast lumps or nipple discharge Resp: cough, shortness of breath, or wheezing CV: chest pain or dyspnea on exertion GI: as per HPI GU: dysuria, trouble voiding, or hematuria MSK: joint pain or joint stiffness Neuro: TIA or stroke symptoms Derm: pruritus and skin  lesion changes Psych: anxiety and depression  Objective   PE Blood pressure 122/75, pulse 73, temperature 97.7 F (36.5 C), temperature source Oral, resp. rate 18, SpO2 100 %. Constitutional: NAD; conversant; no deformities Eyes: Moist conjunctiva; no lid lag; anicteric; PERRL Neck: Trachea midline; no thyromegaly Lungs: Normal respiratory effort; no tactile fremitus CV: RRR; no palpable thrills; no pitting edema GI: Abd soft, tenderness palpation of the right upper and lower abdomen,; no palpable hepatosplenomegaly MSK: Normal range of motion of extremities; no clubbing/cyanosis Psychiatric: Appropriate affect; alert and oriented x3 Lymphatic: No palpable cervical or axillary lymphadenopathy  Results for orders placed or performed during the hospital encounter of 03/24/22 (from the past 24 hour(s))  Lipase, blood     Status: None   Collection Time: 03/24/22  2:58 AM  Result Value Ref Range   Lipase 39 11 - 51 U/L  Comprehensive metabolic panel     Status: Abnormal   Collection Time: 03/24/22  2:58 AM  Result Value Ref Range   Sodium 136 135 - 145 mmol/L   Potassium 4.0 3.5 - 5.1 mmol/L   Chloride 101 98 - 111 mmol/L   CO2 25 22 - 32 mmol/L   Glucose, Bld 111 (H) 70 - 99 mg/dL   BUN 9 6 - 20 mg/dL   Creatinine, Ser 0.93 0.44 - 1.00 mg/dL   Calcium 9.1 8.9 - 10.3 mg/dL   Total Protein 6.9 6.5 - 8.1 g/dL   Albumin 3.8 3.5 - 5.0 g/dL   AST 16 15 - 41 U/L   ALT 13  0 - 44 U/L   Alkaline Phosphatase 57 38 - 126 U/L   Total Bilirubin 0.6 0.3 - 1.2 mg/dL   GFR, Estimated >16 >10 mL/min   Anion gap 10 5 - 15  CBC     Status: Abnormal   Collection Time: 03/24/22  2:58 AM  Result Value Ref Range   WBC 14.8 (H) 4.0 - 10.5 K/uL   RBC 4.05 3.87 - 5.11 MIL/uL   Hemoglobin 13.5 12.0 - 15.0 g/dL   HCT 96.0 45.4 - 09.8 %   MCV 98.5 80.0 - 100.0 fL   MCH 33.3 26.0 - 34.0 pg   MCHC 33.8 30.0 - 36.0 g/dL   RDW 11.9 14.7 - 82.9 %   Platelets 287 150 - 400 K/uL   nRBC 0.0 0.0 - 0.2 %   Urinalysis, Routine w reflex microscopic Urine, Clean Catch     Status: None   Collection Time: 03/24/22  3:02 AM  Result Value Ref Range   Color, Urine YELLOW YELLOW   APPearance CLEAR CLEAR   Specific Gravity, Urine 1.020 1.005 - 1.030   pH 7.0 5.0 - 8.0   Glucose, UA NEGATIVE NEGATIVE mg/dL   Hgb urine dipstick NEGATIVE NEGATIVE   Bilirubin Urine NEGATIVE NEGATIVE   Ketones, ur NEGATIVE NEGATIVE mg/dL   Protein, ur NEGATIVE NEGATIVE mg/dL   Nitrite NEGATIVE NEGATIVE   Leukocytes,Ua NEGATIVE NEGATIVE  I-Stat beta hCG blood, ED     Status: None   Collection Time: 03/24/22  3:04 AM  Result Value Ref Range   I-stat hCG, quantitative <5.0 <5 mIU/mL   Comment 3             Imaging Orders         US Abdomen Limited RUQ (LIVER/GB)         CT Abdomen Pelvis W Contrast         CT ABDOMEN PELVIS W CONTRAST      Assessment and Plan   Brooke Salas is an 32 y.o. female with severe abdominal pain but a normal CT of the abdomen pelvis and normal right upper quadrant ultrasound.  She is thin and the CT with IV and no p.o. contrast is difficult for me to interpret, however the radiologist saw no acute findings, just some fecalized small bowel loops and moderate right colon stool burden.  Her pain is improving some with pain medication in the emergency room but still is more significant than I would anticipate with a relatively normal CT scan.  I recommend repeating a CT scan with p.o. contrast.  This may be diagnostic and reveal something not easily seen on the initial CT scan.  This may be therapeutic and clear out her small bowel and intestine.  Surgery team will follow-up on these results.  Quentin Ore, MD  [facility] Use [email or web address] to contact on call provider  New Patient Billing: [id number] - Moderate MDM

## 2022-03-24 NOTE — Discharge Instructions (Addendum)
You are being prescribed MiraLAX and Colace to help with bowel movements.  It seems like there is a sizable amount of stool in the right side of your abdomen causing your symptoms.  Be sure to drink plenty of water and increase fiber in your diet.  If you develop worsening, continued, or recurrent abdominal pain, uncontrolled vomiting, fever, chest or back pain, or any other new/concerning symptoms then return to the ER for evaluation.

## 2022-03-24 NOTE — ED Triage Notes (Signed)
Patient arrived with EMS from home reports right side abdominal pain with nausea onset this evening after eating supper .

## 2023-01-02 ENCOUNTER — Other Ambulatory Visit (HOSPITAL_COMMUNITY): Payer: Self-pay

## 2023-01-02 DIAGNOSIS — Z3481 Encounter for supervision of other normal pregnancy, first trimester: Secondary | ICD-10-CM

## 2023-01-02 LAB — OB RESULTS CONSOLE HEPATITIS B SURFACE ANTIGEN
Hepatitis B Surface Ag: NEGATIVE
Hepatitis B Surface Ag: NEGATIVE

## 2023-01-02 LAB — HEPATITIS C ANTIBODY: HCV Ab: NEGATIVE

## 2023-01-02 LAB — OB RESULTS CONSOLE HIV ANTIBODY (ROUTINE TESTING): HIV: NONREACTIVE

## 2023-01-02 LAB — OB RESULTS CONSOLE RPR
RPR: NONREACTIVE
RPR: NONREACTIVE

## 2023-01-02 LAB — OB RESULTS CONSOLE RUBELLA ANTIBODY, IGM: Rubella: IMMUNE

## 2023-01-02 LAB — OB RESULTS CONSOLE VARICELLA ZOSTER ANTIBODY, IGG: Varicella: IMMUNE

## 2023-01-03 ENCOUNTER — Ambulatory Visit (HOSPITAL_COMMUNITY)
Admission: RE | Admit: 2023-01-03 | Discharge: 2023-01-03 | Disposition: A | Discharge status: Home / Self Care | Payer: Managed Care, Other (non HMO) | Source: Ambulatory Visit

## 2023-01-03 ENCOUNTER — Other Ambulatory Visit (HOSPITAL_COMMUNITY): Payer: Self-pay

## 2023-01-03 DIAGNOSIS — Z3481 Encounter for supervision of other normal pregnancy, first trimester: Secondary | ICD-10-CM

## 2023-01-03 DIAGNOSIS — Z3A16 16 weeks gestation of pregnancy: Secondary | ICD-10-CM | POA: Diagnosis not present

## 2023-02-28 ENCOUNTER — Encounter: Payer: Self-pay | Admitting: *Deleted

## 2023-02-28 DIAGNOSIS — Z720 Tobacco use: Secondary | ICD-10-CM | POA: Insufficient documentation

## 2023-02-28 DIAGNOSIS — F1290 Cannabis use, unspecified, uncomplicated: Secondary | ICD-10-CM | POA: Insufficient documentation

## 2023-02-28 DIAGNOSIS — O0932 Supervision of pregnancy with insufficient antenatal care, second trimester: Secondary | ICD-10-CM | POA: Insufficient documentation

## 2023-02-28 DIAGNOSIS — A609 Anogenital herpesviral infection, unspecified: Secondary | ICD-10-CM | POA: Insufficient documentation

## 2023-03-04 ENCOUNTER — Other Ambulatory Visit: Payer: Self-pay | Admitting: Nurse Practitioner

## 2023-03-04 DIAGNOSIS — Z363 Encounter for antenatal screening for malformations: Secondary | ICD-10-CM

## 2023-03-05 ENCOUNTER — Other Ambulatory Visit: Payer: Self-pay

## 2023-03-05 ENCOUNTER — Ambulatory Visit (HOSPITAL_BASED_OUTPATIENT_CLINIC_OR_DEPARTMENT_OTHER): Payer: Managed Care, Other (non HMO) | Admitting: Maternal & Fetal Medicine

## 2023-03-05 ENCOUNTER — Ambulatory Visit: Payer: Managed Care, Other (non HMO)

## 2023-03-05 ENCOUNTER — Other Ambulatory Visit: Payer: Self-pay | Admitting: *Deleted

## 2023-03-05 ENCOUNTER — Ambulatory Visit: Payer: Managed Care, Other (non HMO) | Attending: Obstetrics and Gynecology

## 2023-03-05 VITALS — BP 119/57 | HR 89

## 2023-03-05 DIAGNOSIS — O409XX0 Polyhydramnios, unspecified trimester, not applicable or unspecified: Secondary | ICD-10-CM

## 2023-03-05 DIAGNOSIS — Z3A24 24 weeks gestation of pregnancy: Secondary | ICD-10-CM | POA: Diagnosis present

## 2023-03-05 DIAGNOSIS — Z720 Tobacco use: Secondary | ICD-10-CM | POA: Insufficient documentation

## 2023-03-05 DIAGNOSIS — F1910 Other psychoactive substance abuse, uncomplicated: Secondary | ICD-10-CM

## 2023-03-05 DIAGNOSIS — O402XX0 Polyhydramnios, second trimester, not applicable or unspecified: Secondary | ICD-10-CM | POA: Insufficient documentation

## 2023-03-05 DIAGNOSIS — O0932 Supervision of pregnancy with insufficient antenatal care, second trimester: Secondary | ICD-10-CM

## 2023-03-05 DIAGNOSIS — O99330 Smoking (tobacco) complicating pregnancy, unspecified trimester: Secondary | ICD-10-CM

## 2023-03-05 DIAGNOSIS — Z363 Encounter for antenatal screening for malformations: Secondary | ICD-10-CM | POA: Insufficient documentation

## 2023-03-05 NOTE — Progress Notes (Signed)
   Patient information  Patient Name: Brooke Salas  Patient MRN:   [id number]  Referring practice: MFM Referring Provider: [facility])  MFM CONSULT  Brooke Salas is a 32 y.o. G2P1001 at [redacted]w[redacted]d here for ultrasound and consultation. Patient Active Problem List   Diagnosis Date Noted   Polyhydramnios in second trimester 03/05/2023   Tobacco abuse 02/28/2023   Marijuana smoker 02/28/2023   HSV (herpes simplex virus) anogenital infection 02/28/2023   Insufficient prenatal care in second trimester 02/28/2023   Sinus tachycardia 05/18/2013    RE polyhydramnios: I discussed the finding of mild polyhydramnios today.  I discussed that the most likely etiology is idiopathic/nonpathologic with the second most common being gestational diabetes.  I discussed the rare chance of a congenital birth defect that could result in excess of fluid.  Today's ultrasound appears normal regarding the fetal anatomic structures.  I discussed the need to have a glucose test as soon as feasible.  She will notify her OB provider at her next visit.  RE history of tobacco and marijuana use: The patient reports discontinuing tobacco and marijuana upon her knowledge of her pregnancy.  We discussed the potential complications such as growth restriction and the need to continue cessation.  She is currently utilizing nicotine replacement and will continue to do so as long as she finds it beneficial during pregnancy.  Sonographic findings Single intrauterine pregnancy at 24w 6d. Fetal cardiac activity:  Observed and appears normal. Presentation: Cephalic. The anatomic structures that were well seen appear normal without evidence of soft markers. The anatomic survey is complete.  Fetal biometry shows the estimated fetal weight at the 69 percentile. Amniotic fluid: Polyhydramnios.  MVP: 9.69 cm. Placenta: Anterior. Adnexa: No abnormality visualized. Cervical length: 3 cm.  Recommendations -EDD  is Estimated Date of Delivery: 06/19/23 based on a previous ultrasound done on 01/03/2023. -Detailed ultrasound was done today without abnormalities . -Glucola at next prenatal visit -Follow-up ultrasound in 6 weeks to assess amniotic fluid and biometry.  Review of Systems: A review of systems was performed and was negative except per HPI   Vitals and Physical Exam    03/05/2023    1:29 PM 03/24/2022    5:46 PM 03/24/2022    1:15 PM  Vitals with BMI  Systolic 119 126 102  Diastolic 57 90 98  Pulse 89 70 72    Sitting comfortably on the sonogram table Nonlabored breathing Normal rate and rhythm Abdomen is nontender  Past pregnancies OB History  Gravida Para Term Preterm AB Living  2 1 1   1   SAB IAB Ectopic Multiple Live Births          # Outcome Date GA Lbr Len/2nd Weight Sex Type Anes PTL Lv  2 Current           1 Term              I spent 30 minutes reviewing the patients chart, including labs and images as well as counseling the patient about her medical conditions. Greater than 50% of the time was spent in direct face-to-face patient counseling.  Braxton Feathers  MFM, [place]   03/05/2023  3:01 PM

## 2023-03-19 NOTE — L&D Delivery Note (Signed)
 OB/GYN Faculty Practice Delivery Note  RUSSELL ENGELSTAD is a 33 y.o. G2P1001 s/p SVD at [redacted]w[redacted]d. She was admitted for SROM.   ROM: 26h 48m with clear fluid GBS Status: Negative/-- (03/06 0000) Maximum Maternal Temperature: 98.23F   Labor Progress: Initial SVE: 3/70/-1. She then progressed to complete.   Delivery Date/Time: 06/07/23 0754 Delivery: Called to room and patient was complete and pushing. Head delivered ROA. Delivered through a loose nuchal cord, which was reduced at the perineum. Shoulder and body delivered in usual fashion. Infant with spontaneous cry, placed on mother's abdomen, dried and stimulated. Cord clamped x 2 after 1-minute delay, and cut by patient. Cord blood drawn. Placenta delivered spontaneously with gentle cord traction. Fundus firm with massage and Pitocin. Labia, perineum, vagina, and cervix inspected inspected with no lacerations noted.  Baby Weight: pending  Placenta: Sent to L&D Complications: None Lacerations: None EBL: 350 mL Analgesia: Epidural   Infant:  APGARS pending  [name], MD OB Fellow, Faculty Practice [facility], Center for [facility]

## 2023-04-16 ENCOUNTER — Ambulatory Visit: Payer: 59 | Attending: Maternal & Fetal Medicine

## 2023-04-16 ENCOUNTER — Other Ambulatory Visit: Payer: Self-pay

## 2023-04-16 DIAGNOSIS — O0932 Supervision of pregnancy with insufficient antenatal care, second trimester: Secondary | ICD-10-CM | POA: Diagnosis present

## 2023-04-16 DIAGNOSIS — O409XX0 Polyhydramnios, unspecified trimester, not applicable or unspecified: Secondary | ICD-10-CM | POA: Insufficient documentation

## 2023-04-16 DIAGNOSIS — O403XX0 Polyhydramnios, third trimester, not applicable or unspecified: Secondary | ICD-10-CM

## 2023-04-16 DIAGNOSIS — F17210 Nicotine dependence, cigarettes, uncomplicated: Secondary | ICD-10-CM

## 2023-04-16 DIAGNOSIS — O99330 Smoking (tobacco) complicating pregnancy, unspecified trimester: Secondary | ICD-10-CM | POA: Insufficient documentation

## 2023-04-16 DIAGNOSIS — O99333 Smoking (tobacco) complicating pregnancy, third trimester: Secondary | ICD-10-CM | POA: Diagnosis not present

## 2023-04-16 DIAGNOSIS — Z3A30 30 weeks gestation of pregnancy: Secondary | ICD-10-CM

## 2023-04-16 DIAGNOSIS — O0933 Supervision of pregnancy with insufficient antenatal care, third trimester: Secondary | ICD-10-CM

## 2023-05-22 LAB — OB RESULTS CONSOLE GBS: GBS: NEGATIVE

## 2023-05-22 LAB — OB RESULTS CONSOLE GC/CHLAMYDIA
Chlamydia: NEGATIVE
Neisseria Gonorrhea: NEGATIVE

## 2023-06-06 ENCOUNTER — Encounter (HOSPITAL_COMMUNITY): Payer: Self-pay | Admitting: Obstetrics & Gynecology

## 2023-06-06 ENCOUNTER — Inpatient Hospital Stay (HOSPITAL_COMMUNITY): Admitting: Anesthesiology

## 2023-06-06 ENCOUNTER — Inpatient Hospital Stay (HOSPITAL_COMMUNITY)
Admission: AD | Admit: 2023-06-06 | Discharge: 2023-06-08 | DRG: 806 | Disposition: A | Discharge status: Home / Self Care | Attending: Obstetrics & Gynecology | Admitting: Obstetrics & Gynecology

## 2023-06-06 DIAGNOSIS — O4202 Full-term premature rupture of membranes, onset of labor within 24 hours of rupture: Secondary | ICD-10-CM | POA: Diagnosis not present

## 2023-06-06 DIAGNOSIS — Z8249 Family history of ischemic heart disease and other diseases of the circulatory system: Secondary | ICD-10-CM | POA: Diagnosis not present

## 2023-06-06 DIAGNOSIS — O9832 Other infections with a predominantly sexual mode of transmission complicating childbirth: Secondary | ICD-10-CM | POA: Diagnosis present

## 2023-06-06 DIAGNOSIS — F17210 Nicotine dependence, cigarettes, uncomplicated: Secondary | ICD-10-CM | POA: Diagnosis present

## 2023-06-06 DIAGNOSIS — Z7982 Long term (current) use of aspirin: Secondary | ICD-10-CM | POA: Diagnosis not present

## 2023-06-06 DIAGNOSIS — Z3490 Encounter for supervision of normal pregnancy, unspecified, unspecified trimester: Secondary | ICD-10-CM

## 2023-06-06 DIAGNOSIS — F1290 Cannabis use, unspecified, uncomplicated: Secondary | ICD-10-CM | POA: Diagnosis present

## 2023-06-06 DIAGNOSIS — O4290 Premature rupture of membranes, unspecified as to length of time between rupture and onset of labor, unspecified weeks of gestation: Secondary | ICD-10-CM | POA: Diagnosis present

## 2023-06-06 DIAGNOSIS — A6000 Herpesviral infection of urogenital system, unspecified: Secondary | ICD-10-CM | POA: Diagnosis present

## 2023-06-06 DIAGNOSIS — Z3A38 38 weeks gestation of pregnancy: Secondary | ICD-10-CM | POA: Diagnosis not present

## 2023-06-06 DIAGNOSIS — Z833 Family history of diabetes mellitus: Secondary | ICD-10-CM | POA: Diagnosis not present

## 2023-06-06 DIAGNOSIS — O99324 Drug use complicating childbirth: Principal | ICD-10-CM | POA: Diagnosis present

## 2023-06-06 DIAGNOSIS — O99334 Smoking (tobacco) complicating childbirth: Secondary | ICD-10-CM | POA: Diagnosis present

## 2023-06-06 DIAGNOSIS — M545 Low back pain, unspecified: Secondary | ICD-10-CM

## 2023-06-06 DIAGNOSIS — O26893 Other specified pregnancy related conditions, third trimester: Secondary | ICD-10-CM | POA: Diagnosis present

## 2023-06-06 DIAGNOSIS — O403XX0 Polyhydramnios, third trimester, not applicable or unspecified: Secondary | ICD-10-CM | POA: Diagnosis not present

## 2023-06-06 LAB — CBC
HCT: 35.1 % — ABNORMAL LOW (ref 36.0–46.0)
Hemoglobin: 12 g/dL (ref 12.0–15.0)
MCH: 33.5 pg (ref 26.0–34.0)
MCHC: 34.2 g/dL (ref 30.0–36.0)
MCV: 98 fL (ref 80.0–100.0)
Platelets: 217 10*3/uL (ref 150–400)
RBC: 3.58 MIL/uL — ABNORMAL LOW (ref 3.87–5.11)
RDW: 14.6 % (ref 11.5–15.5)
WBC: 11.5 10*3/uL — ABNORMAL HIGH (ref 4.0–10.5)
nRBC: 0 % (ref 0.0–0.2)

## 2023-06-06 LAB — TYPE AND SCREEN
ABO/RH(D): AB POS
Antibody Screen: NEGATIVE

## 2023-06-06 LAB — COMPREHENSIVE METABOLIC PANEL
ALT: 21 U/L (ref 0–44)
AST: 26 U/L (ref 15–41)
Albumin: 2.9 g/dL — ABNORMAL LOW (ref 3.5–5.0)
Alkaline Phosphatase: 100 U/L (ref 38–126)
Anion gap: 12 (ref 5–15)
BUN: 9 mg/dL (ref 6–20)
CO2: 19 mmol/L — ABNORMAL LOW (ref 22–32)
Calcium: 8.8 mg/dL — ABNORMAL LOW (ref 8.9–10.3)
Chloride: 104 mmol/L (ref 98–111)
Creatinine, Ser: 0.63 mg/dL (ref 0.44–1.00)
GFR, Estimated: >60 mL/min (ref >60)
Glucose, Bld: 81 mg/dL (ref 70–99)
Potassium: 3.8 mmol/L (ref 3.5–5.1)
Sodium: 135 mmol/L (ref 135–145)
Total Bilirubin: 0.4 mg/dL (ref 0.0–1.2)
Total Protein: 6 g/dL — ABNORMAL LOW (ref 6.5–8.1)

## 2023-06-06 MED ORDER — LACTATED RINGERS IV SOLN
500.0000 mL | Freq: Once | INTRAVENOUS | Status: AC
  Administered 2023-06-06: 500 mL via INTRAVENOUS

## 2023-06-06 MED ORDER — OXYCODONE-ACETAMINOPHEN 5-325 MG PO TABS: 1.0000 | ORAL_TABLET | ORAL | Status: DC | PRN

## 2023-06-06 MED ORDER — ACETAMINOPHEN 325 MG PO TABS: 650.0000 mg | ORAL_TABLET | ORAL | Status: DC | PRN

## 2023-06-06 MED ORDER — ONDANSETRON HCL 4 MG/2ML IJ SOLN: 4.0000 mg | Freq: Four times a day (QID) | INTRAMUSCULAR | Status: DC | PRN

## 2023-06-06 MED ORDER — OXYTOCIN BOLUS FROM INFUSION: 333.0000 mL | Freq: Once | INTRAVENOUS | Status: DC

## 2023-06-06 MED ORDER — LIDOCAINE HCL (PF) 1 % IJ SOLN: 30.0000 mL | INTRAMUSCULAR | Status: DC | PRN

## 2023-06-06 MED ORDER — VALACYCLOVIR HCL 500 MG PO TABS
500.0000 mg | ORAL_TABLET | Freq: Two times a day (BID) | ORAL | Status: DC
  Administered 2023-06-07 – 2023-06-08 (×3): 500 mg via ORAL
  Filled 2023-06-06 (×3): qty 1

## 2023-06-06 MED ORDER — FLEET ENEMA RE ENEM: 1.0000 | ENEMA | RECTAL | Status: DC | PRN

## 2023-06-06 MED ORDER — LIDOCAINE HCL (PF) 1 % IJ SOLN
30.0000 mL | INTRAMUSCULAR | Status: DC | PRN
Start: 2023-06-06 — End: 2023-06-06

## 2023-06-06 MED ORDER — LACTATED RINGERS IV SOLN
INTRAVENOUS | Status: DC
Start: 2023-06-06 — End: 2023-06-06

## 2023-06-06 MED ORDER — LIDOCAINE HCL (PF) 1 % IJ SOLN
INTRAMUSCULAR | Status: DC | PRN
  Administered 2023-06-06: 11 mL via EPIDURAL

## 2023-06-06 MED ORDER — OXYTOCIN BOLUS FROM INFUSION
333.0000 mL | Freq: Once | INTRAVENOUS | Status: AC
  Administered 2023-06-07: 333 mL via INTRAVENOUS

## 2023-06-06 MED ORDER — LACTATED RINGERS IV SOLN: INTRAVENOUS | Status: DC

## 2023-06-06 MED ORDER — PHENYLEPHRINE 80 MCG/ML (10ML) SYRINGE FOR IV PUSH (FOR BLOOD PRESSURE SUPPORT): 80.0000 ug | PREFILLED_SYRINGE | INTRAVENOUS | Status: DC | PRN

## 2023-06-06 MED ORDER — TERBUTALINE SULFATE 1 MG/ML IJ SOLN
0.2500 mg | Freq: Once | INTRAMUSCULAR | Status: AC | PRN
  Administered 2023-06-06: 0.25 mg via SUBCUTANEOUS
  Filled 2023-06-06: qty 1

## 2023-06-06 MED ORDER — OXYTOCIN-SODIUM CHLORIDE 30-0.9 UT/500ML-% IV SOLN: 2.5000 [IU]/h | INTRAVENOUS | Status: DC

## 2023-06-06 MED ORDER — DIPHENHYDRAMINE HCL 50 MG/ML IJ SOLN
12.5000 mg | INTRAMUSCULAR | Status: DC | PRN
  Administered 2023-06-06 – 2023-06-07 (×2): 12.5 mg via INTRAVENOUS
  Filled 2023-06-06: qty 1

## 2023-06-06 MED ORDER — OXYCODONE-ACETAMINOPHEN 5-325 MG PO TABS: 2.0000 | ORAL_TABLET | ORAL | Status: DC | PRN

## 2023-06-06 MED ORDER — SOD CITRATE-CITRIC ACID 500-334 MG/5ML PO SOLN
30.0000 mL | ORAL | Status: DC | PRN
Start: 2023-06-06 — End: 2023-06-06

## 2023-06-06 MED ORDER — SOD CITRATE-CITRIC ACID 500-334 MG/5ML PO SOLN: 30.0000 mL | ORAL | Status: DC | PRN

## 2023-06-06 MED ORDER — FENTANYL CITRATE (PF) 100 MCG/2ML IJ SOLN
50.0000 ug | INTRAMUSCULAR | Status: DC | PRN
Start: 2023-06-06 — End: 2023-06-06

## 2023-06-06 MED ORDER — EPHEDRINE 5 MG/ML INJ: 10.0000 mg | INTRAVENOUS | Status: DC | PRN

## 2023-06-06 MED ORDER — OXYTOCIN-SODIUM CHLORIDE 30-0.9 UT/500ML-% IV SOLN
2.5000 [IU]/h | INTRAVENOUS | Status: DC
  Administered 2023-06-07: 2.5 [IU]/h via INTRAVENOUS

## 2023-06-06 MED ORDER — OXYTOCIN-SODIUM CHLORIDE 30-0.9 UT/500ML-% IV SOLN
1.0000 m[IU]/min | INTRAVENOUS | Status: DC
  Administered 2023-06-06: 2 m[IU]/min via INTRAVENOUS
  Filled 2023-06-06: qty 500

## 2023-06-06 MED ORDER — LACTATED RINGERS IV SOLN
500.0000 mL | INTRAVENOUS | Status: DC | PRN
  Administered 2023-06-06: 500 mL via INTRAVENOUS

## 2023-06-06 MED ORDER — ONDANSETRON HCL 4 MG/2ML IJ SOLN
4.0000 mg | Freq: Four times a day (QID) | INTRAMUSCULAR | Status: DC | PRN
Start: 2023-06-06 — End: 2023-06-06

## 2023-06-06 MED ORDER — FENTANYL-BUPIVACAINE-NACL 0.5-0.125-0.9 MG/250ML-% EP SOLN
12.0000 mL/h | EPIDURAL | Status: DC | PRN
  Administered 2023-06-06: 12 mL/h via EPIDURAL
  Filled 2023-06-06: qty 250

## 2023-06-06 MED ORDER — LACTATED RINGERS IV SOLN: 500.0000 mL | INTRAVENOUS | Status: DC | PRN

## 2023-06-06 MED ORDER — FENTANYL CITRATE (PF) 100 MCG/2ML IJ SOLN
50.0000 ug | INTRAMUSCULAR | Status: DC | PRN
Start: 2023-06-06 — End: 2023-06-07

## 2023-06-06 NOTE — Anesthesia Procedure Notes (Signed)
 Epidural Patient location during procedure: OB Start time: 06/06/2023 7:58 PM End time: 06/06/2023 8:13 PM  Staffing Anesthesiologist: Lowella Curb, MD Performed: anesthesiologist   Preanesthetic Checklist Completed: patient identified, IV checked, site marked, risks and benefits discussed, surgical consent, monitors and equipment checked, pre-op evaluation and timeout performed  Epidural Patient position: sitting Prep: ChloraPrep Patient monitoring: heart rate, cardiac monitor, continuous pulse ox and blood pressure Approach: midline Injection technique: LOR air  Needle:  Needle type: Tuohy  Needle gauge: 17 G Needle length: 9 cm Needle insertion depth: 9 cm Catheter type: closed end flexible Catheter size: 20 Guage Catheter at skin depth: 10 cm Test dose: negative  Assessment Events: blood not aspirated, injection not painful, no injection resistance, no paresthesia and negative IV test  Additional Notes Reason for block:procedure for pain

## 2023-06-06 NOTE — H&P (Signed)
 OBSTETRIC ADMISSION HISTORY AND PHYSICAL  Brooke Salas is a 33 y.o. female G2P1001 with IUP at [redacted]w[redacted]d by [place] presenting for SROM. She reports +FMs, no VB, no blurry vision, headaches or peripheral edema, and RUQ pain.  She plans on formula feeding. She request condoms for birth control. She received her prenatal care at [facility]   Dating: By [place] --->  Estimated Date of Delivery: 06/19/23  Sono:    @[redacted]w[redacted]d , CWD, normal anatomy, cephalic presentation, anterior placental lie, 1654g, 38% EFW   Prenatal History/Complications:  Patient Active Problem List   Diagnosis Date Noted   Leakage of amniotic fluid 06/06/2023   Pregnant 06/06/2023   Polyhydramnios in second trimester 03/05/2023   Tobacco abuse 02/28/2023   Marijuana smoker 02/28/2023   HSV (herpes simplex virus) anogenital infection 02/28/2023   Insufficient prenatal care in second trimester 02/28/2023   Sinus tachycardia 05/18/2013     Past Medical History: Past Medical History:  Diagnosis Date   Herpes    Hypokalemia 05/18/2013   Nausea & vomiting 05/18/2013   PNA (pneumonia) 05/18/2013    Past Surgical History: Past Surgical History:  Procedure Laterality Date   NO PAST SURGERIES      Obstetrical History: OB History     Gravida  2   Para  1   Term  1   Preterm      AB      Living  1      SAB      IAB      Ectopic      Multiple      Live Births  1           Social History Social History   Socioeconomic History   Marital status: Single    Spouse name: Not on file   Number of children: Not on file   Years of education: Not on file   Highest education level: Not on file  Occupational History   Occupation: [employer]  Tobacco Use   Smoking status: Every Day    Current packs/day: 1.00    Types: Cigarettes   Smokeless tobacco: Never  Substance and Sexual Activity   Alcohol use: No   Drug use: Never   Sexual activity: Not Currently  Other Topics Concern   Not on file   Social History Narrative   Not on file   Social Drivers of Health   Financial Resource Strain: Not on file  Food Insecurity: Not on file  Transportation Needs: Not on file  Physical Activity: Not on file  Stress: Not on file  Social Connections: Not on file    Family History: Family History  Problem Relation Age of Onset   Hypertension Mother    Heart disease Mother    Diabetes Mother    Cancer Maternal Aunt    Hypertension Maternal Grandmother    Diabetes Maternal Grandmother    Asthma Neg Hx     Allergies: No Known Allergies  Medications Prior to Admission  Medication Sig Dispense Refill Last Dose/Taking   aspirin EC 81 MG tablet Take 81 mg by mouth daily. Swallow whole.   06/06/2023   Prenatal Vit-Fe Fumarate-FA (MULTIVITAMIN-PRENATAL) 27-0.8 MG TABS tablet Take 1 tablet by mouth daily at 12 noon.   06/06/2023   valACYclovir (VALTREX) 500 MG tablet Take 500 mg by mouth 2 (two) times daily.   06/06/2023   docusate sodium (COLACE) 100 MG capsule Take 1 capsule (100 mg total) by mouth every 12 (twelve)  hours. (Patient not taking: Reported on 03/05/2023) 60 capsule 0    ibuprofen (ADVIL) 200 MG tablet Take 600-800 mg by mouth as needed for moderate pain. (Patient not taking: Reported on 03/05/2023)      naproxen (NAPROSYN) 375 MG tablet Take 1 tablet (375 mg total) by mouth 2 (two) times daily. (Patient not taking: Reported on 03/05/2023) 20 tablet 0    polyethylene glycol (MIRALAX / GLYCOLAX) 17 g packet Take 17 g by mouth daily as needed. (Patient not taking: Reported on 03/05/2023) 14 each 0      Review of Systems   All systems reviewed and negative except as stated in HPI  Blood pressure 127/83, pulse 76, temperature 98.3 F (36.8 C), temperature source Oral, resp. rate 16, height 5\' 5"  (1.651 m), weight 83.2 kg, SpO2 100%. General appearance: alert, cooperative, and appears stated age Lungs: clear to auscultation bilaterally Heart: regular rate and rhythm Abdomen:  soft, non-tender; bowel sounds normal Pelvic: normal female genitalia, negative speculum on admission Extremities: Homans sign is negative, no sign of DVT Presentation: cephalic Fetal monitoringBaseline: 125 bpm, Variability: Good {> 6 bpm), Accelerations: Reactive, and Decelerations: Absent though single prolonged decel @ 1220 Uterine activity None Dilation: 3 Effacement (%): 70 Station: -1 Exam by:: Quin Hoop, CNM student   Prenatal labs: ABO, Rh: --/--/AB POS (03/21 1258) Antibody: NEG (03/21 1258) Rubella: Immune (10/17 0000) RPR: Nonreactive (10/17 0000)  HBsAg: Negative (10/17 0000)  HIV: Non-reactive (10/17 0000)  GBS: Negative/-- (03/06 0000)    Lab Results  Component Value Date   GBS Negative 05/22/2023   GTT nrl Genetic screening  NIPS LR female, AFP neg, Horizon 4/4 neg,  Anatomy US nrl    Prenatal Transfer Tool  Maternal Diabetes: No Genetic Screening: Normal Maternal Ultrasounds/Referrals: Other: Polyhydramnios  Fetal Ultrasounds or other Referrals:  Referred to Materal Fetal Medicine  Maternal Substance Abuse:  Yes:  Type: Smoker, Marijuana Significant Maternal Medications:  Meds include: Other: Valacyclovir,aspirin 81mg    Significant Maternal Lab Results: Group B Strep negative Number of Prenatal Visits:greater than 3 verified prenatal visits Maternal Vaccinations: Tdap prenatally, declined others  Other Comments:  None   Results for orders placed or performed during the hospital encounter of 06/06/23 (from the past 24 hours)  Type and screen [facility]   Collection Time: 06/06/23 12:58 PM  Result Value Ref Range   ABO/RH(D) AB POS    Antibody Screen NEG    Sample Expiration      06/09/2023,2359 Performed at [facility], [address] [place], [place] [address]   CBC   Collection Time: 06/06/23  2:30 PM  Result Value Ref Range   WBC 11.5 (H) 4.0 - 10.5 K/uL   RBC 3.58 (L) 3.87 - 5.11 MIL/uL   Hemoglobin 12.0 12.0 -  15.0 g/dL   HCT 60.4 (L) 54.0 - 98.1 %   MCV 98.0 80.0 - 100.0 fL   MCH 33.5 26.0 - 34.0 pg   MCHC 34.2 30.0 - 36.0 g/dL   RDW 19.1 47.8 - 29.5 %   Platelets 217 150 - 400 K/uL   nRBC 0.0 0.0 - 0.2 %  Comprehensive metabolic panel   Collection Time: 06/06/23  2:30 PM  Result Value Ref Range   Sodium 135 135 - 145 mmol/L   Potassium 3.8 3.5 - 5.1 mmol/L   Chloride 104 98 - 111 mmol/L   CO2 19 (L) 22 - 32 mmol/L   Glucose, Bld 81 70 - 99  mg/dL   BUN 9 6 - 20 mg/dL   Creatinine, Ser 4.09 0.44 - 1.00 mg/dL   Calcium 8.8 (L) 8.9 - 10.3 mg/dL   Total Protein 6.0 (L) 6.5 - 8.1 g/dL   Albumin 2.9 (L) 3.5 - 5.0 g/dL   AST 26 15 - 41 U/L   ALT 21 0 - 44 U/L   Alkaline Phosphatase 100 38 - 126 U/L   Total Bilirubin 0.4 0.0 - 1.2 mg/dL   GFR, Estimated >81 >19 mL/min   Anion gap 12 5 - 15    Patient Active Problem List   Diagnosis Date Noted   Leakage of amniotic fluid 06/06/2023   Pregnant 06/06/2023   Polyhydramnios in second trimester 03/05/2023   Tobacco abuse 02/28/2023   Marijuana smoker 02/28/2023   HSV (herpes simplex virus) anogenital infection 02/28/2023   Insufficient prenatal care in second trimester 02/28/2023   Sinus tachycardia 05/18/2013    Assessment/Plan:  Brooke Salas is a 33 y.o. G2P1001 at [redacted]w[redacted]d here for SROM 3/21, morning hours.   #Labor: SVE 3/70/-1, starting low dose pitocin. #Pain: Epidural PRN #FWB: Cat 1, previously prolonged Decel  #GBS status:  negative #Feeding: Formula #Reproductive Life planning: Condoms #Circ:  yes  #Resolved Polyhydramnios, mild: [place] 12/18 showed poly, largest pocket 9.6cm, F/u 1/29 normal AFI, @[redacted]w[redacted]d , CWD, normal anatomy, cephalic presentation, anterior placental lie, 1654g, 38% EFW  #Marijuana Use in Pregnancy:Patient states she has not used since early pregnancy. #Tobacco Use in pregnancy: Started on Nicotine replacement 12/2022, +UDS for nicotine 3/6  #HSV: reports on suppresion w/o recent outbreak, negative speculum  exam on admission - continue home suppression   Elige Ko, Student-MidWife  06/06/2023, 5:24 PM

## 2023-06-06 NOTE — Progress Notes (Signed)
 Labor Progress Note Brooke Salas is a 33 y.o. G2P1001 at [redacted]w[redacted]d presented for SROM this morning.  S: Patient is resting comfortably.  O:  BP (!) 107/92   Pulse 77   Temp 98.1 F (36.7 C) (Oral)   Resp 16   Ht 5\' 5"  (1.651 m)   Wt 83.2 kg   SpO2 100%   BMI 30.54 kg/m  EFM: baseline 120 / moderate variability / + accelerations  CVE: Dilation: 4.5 Effacement (%): 80 Station: -1 Presentation: Vertex Exam by:: Mary [place] Johnson, RNc-OB   A&P: 33 y.o. G2P1001 [redacted]w[redacted]d presenting for SROM this AM. Hx of HSV w/o recent outbreak, resolved polyhydramnios, and marijuana and tobacco use in pregnancy.  #Labor: On Pit. Progressing well. #Pain: Epidural #FWB: Cat 1 #GBS negative #Resolved Polyhydramnios  #Marijuana Use in Pregnancy #Tobacco Use in pregnancy: Started on Nicotine replacement 12/2022, +UDS for nicotine 3/6  #HSV: on suppresion w/o recent outbreak, negative speculum exam on admission; continue Valtrex  Fortunato Curling, DO Center for [employer], [facility] 10:24 PM

## 2023-06-06 NOTE — Anesthesia Preprocedure Evaluation (Signed)
Anesthesia Evaluation  Patient identified by MRN, date of birth, ID band Patient awake    Reviewed: Allergy & Precautions, H&P , NPO status , Patient's Chart, lab work & pertinent test results  Airway Mallampati: II  TM Distance: >3 FB Neck ROM: Full    Dental no notable dental hx.    Pulmonary neg pulmonary ROS, Current Smoker   Pulmonary exam normal breath sounds clear to auscultation       Cardiovascular negative cardio ROS Normal cardiovascular exam Rhythm:Regular Rate:Normal     Neuro/Psych negative neurological ROS  negative psych ROS   GI/Hepatic negative GI ROS, Neg liver ROS,,,  Endo/Other  negative endocrine ROS    Renal/GU negative Renal ROS  negative genitourinary   Musculoskeletal negative musculoskeletal ROS (+)    Abdominal   Peds negative pediatric ROS (+)  Hematology negative hematology ROS (+)   Anesthesia Other Findings   Reproductive/Obstetrics negative OB ROS                              Anesthesia Physical Anesthesia Plan  ASA: 2  Anesthesia Plan: Epidural   Post-op Pain Management:    Induction:   PONV Risk Score and Plan:   Airway Management Planned:   Additional Equipment:   Intra-op Plan:   Post-operative Plan:   Informed Consent:   Plan Discussed with:   Anesthesia Plan Comments:         Anesthesia Quick Evaluation

## 2023-06-06 NOTE — MAU Note (Signed)
 Brooke Salas is a 33 y.o. at [redacted]w[redacted]d here in MAU reporting: yesterday, she thinks her mucous plug came out.  Today when she was walking at work, her panties just kept getting wet. Having back pains.  No bleeding, denies recent intercourse. +FM.   Onset of complaint: this morning Pain score: mild Vitals:   06/06/23 1134  BP: 125/73  Pulse: 92  Resp: 18  Temp: 98.5 F (36.9 C)  SpO2: 100%     FHT:153 Lab orders placed from triage:  fern

## 2023-06-06 NOTE — MAU Provider Note (Signed)
 MAU Provider Note  Chief Complaint: Rupture of Membranes and Back Pain  SUBJECTIVE HPI: Brooke Salas is a 33 y.o. G2P1001 at [redacted]w[redacted]d by midtrimester ultrasound who presents to maternity admissions reporting continuous slow leaking of fluid since this morning and central lower back pain.   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 Receives [facility] with [facility].  Single prolonged decel while in MAU to the 80s, improved with repositioning.   HPI  Past Medical History:  Diagnosis Date   Herpes    Hypokalemia 05/18/2013   Nausea & vomiting 05/18/2013   PNA (pneumonia) 05/18/2013   Past Surgical History:  Procedure Laterality Date   NO PAST SURGERIES     Social History   Socioeconomic History   Marital status: Single    Spouse name: Not on file   Number of children: Not on file   Years of education: Not on file   Highest education level: Not on file  Occupational History   Not on file  Tobacco Use   Smoking status: Every Day    Current packs/day: 1.00    Types: Cigarettes   Smokeless tobacco: Never  Substance and Sexual Activity   Alcohol use: No   Drug use: Never   Sexual activity: Not Currently  Other Topics Concern   Not on file  Social History Narrative   Not on file   Social Drivers of Health   Financial Resource Strain: Not on file  Food Insecurity: Not on file  Transportation Needs: Not on file  Physical Activity: Not on file  Stress: Not on file  Social Connections: Not on file  Intimate Partner Violence: Not on file   No current facility-administered medications on file prior to encounter.   Current Outpatient Medications on File Prior to Encounter  Medication Sig Dispense Refill   aspirin EC 81 MG tablet Take 81 mg by mouth daily. Swallow whole.     Prenatal Vit-Fe Fumarate-FA (MULTIVITAMIN-PRENATAL) 27-0.8 MG TABS tablet Take 1 tablet by mouth daily at 12 noon.     valACYclovir  (VALTREX) 500 MG tablet Take 500 mg by mouth 2 (two) times daily.     docusate sodium (COLACE) 100 MG capsule Take 1 capsule (100 mg total) by mouth every 12 (twelve) hours. (Patient not taking: Reported on 03/05/2023) 60 capsule 0   ibuprofen (ADVIL) 200 MG tablet Take 600-800 mg by mouth as needed for moderate pain. (Patient not taking: Reported on 03/05/2023)     naproxen (NAPROSYN) 375 MG tablet Take 1 tablet (375 mg total) by mouth 2 (two) times daily. (Patient not taking: Reported on 03/05/2023) 20 tablet 0   polyethylene glycol (MIRALAX / GLYCOLAX) 17 g packet Take 17 g by mouth daily as needed. (Patient not taking: Reported on 03/05/2023) 14 each 0   No Known Allergies  ROS:  Pertinent positives/negatives listed above.  I have reviewed patient's Past Medical Hx, Surgical Hx, Family Hx, Social Hx, medications and allergies.   Physical Exam  Patient Vitals for the past 24 hrs:  BP Temp Temp src Pulse Resp SpO2 Height Weight  06/06/23 1150 127/69 -- -- 96 -- -- -- --  06/06/23 1134 125/73 98.5 F (36.9 C) Oral 92 18 100 % 5\' 5"  (1.651 m) 83.2 kg   Constitutional: Well-developed, well-nourished female in no acute distress  Cardiovascular: normal rate Respiratory: normal effort GI: Abd soft, non-tender MS: Extremities nontender, no edema, normal ROM Neurologic: Alert and oriented x  4  GU: Neg CVAT.  PELVIC EXAM: Cervix pink, visually 2 cm, without lesion, pooling positive, moderate milky cervical discharge, vaginal walls and external genitalia normal  FHT:  Baseline 130, moderate variability, accelerations present, no decelerations Contractions: uterine irritability  LAB RESULTS No results found for this or any previous visit (from the past 24 hours).     IMAGING No results found.  MAU Management/MDM: Orders Placed This Encounter  Procedures   Fern Test    No orders of the defined types were placed in this encounter.  Available prenatal records  reviewed.  ASSESSMENT 1. [redacted] weeks gestation of pregnancy   2. Leakage of amniotic fluid   3. Acute midline low back pain without sciatica   Uterine irritability, ample pooling on sterile speculum exam Vertex on bedside US  PLAN Admit to L&D for IOL due to SROM this morning  Wyn Forster, MD FMOB Fellow, Faculty practice [facility], Center for [facility]  06/06/2023  1:06 PM

## 2023-06-07 ENCOUNTER — Encounter (HOSPITAL_COMMUNITY): Payer: Self-pay | Admitting: Obstetrics and Gynecology

## 2023-06-07 DIAGNOSIS — O4202 Full-term premature rupture of membranes, onset of labor within 24 hours of rupture: Secondary | ICD-10-CM

## 2023-06-07 DIAGNOSIS — O99324 Drug use complicating childbirth: Secondary | ICD-10-CM

## 2023-06-07 DIAGNOSIS — O403XX0 Polyhydramnios, third trimester, not applicable or unspecified: Secondary | ICD-10-CM

## 2023-06-07 DIAGNOSIS — O9832 Other infections with a predominantly sexual mode of transmission complicating childbirth: Secondary | ICD-10-CM

## 2023-06-07 DIAGNOSIS — O99334 Smoking (tobacco) complicating childbirth: Secondary | ICD-10-CM

## 2023-06-07 DIAGNOSIS — Z3A38 38 weeks gestation of pregnancy: Secondary | ICD-10-CM

## 2023-06-07 LAB — RPR: RPR Ser Ql: NONREACTIVE

## 2023-06-07 MED ORDER — DIPHENHYDRAMINE HCL 25 MG PO CAPS: 25.0000 mg | ORAL_CAPSULE | Freq: Four times a day (QID) | ORAL | Status: DC | PRN

## 2023-06-07 MED ORDER — IBUPROFEN 800 MG PO TABS
800.0000 mg | ORAL_TABLET | Freq: Three times a day (TID) | ORAL | Status: DC
  Administered 2023-06-07 – 2023-06-08 (×3): 800 mg via ORAL
  Filled 2023-06-07 (×4): qty 1

## 2023-06-07 MED ORDER — DIBUCAINE (PERIANAL) 1 % EX OINT: 1.0000 | TOPICAL_OINTMENT | CUTANEOUS | Status: DC | PRN

## 2023-06-07 MED ORDER — OXYCODONE HCL 5 MG PO TABS: 10.0000 mg | ORAL_TABLET | ORAL | Status: DC | PRN

## 2023-06-07 MED ORDER — COCONUT OIL OIL: 1.0000 | TOPICAL_OIL | Status: DC | PRN

## 2023-06-07 MED ORDER — ONDANSETRON HCL 4 MG/2ML IJ SOLN: 4.0000 mg | INTRAMUSCULAR | Status: DC | PRN

## 2023-06-07 MED ORDER — SIMETHICONE 80 MG PO CHEW: 80.0000 mg | CHEWABLE_TABLET | ORAL | Status: DC | PRN

## 2023-06-07 MED ORDER — WITCH HAZEL-GLYCERIN EX PADS: 1.0000 | MEDICATED_PAD | CUTANEOUS | Status: DC | PRN

## 2023-06-07 MED ORDER — ONDANSETRON HCL 4 MG PO TABS: 4.0000 mg | ORAL_TABLET | ORAL | Status: DC | PRN

## 2023-06-07 MED ORDER — SODIUM CHLORIDE 0.9 % IV SOLN: 250.0000 mL | INTRAVENOUS | Status: DC | PRN

## 2023-06-07 MED ORDER — OXYCODONE HCL 5 MG PO TABS: 5.0000 mg | ORAL_TABLET | ORAL | Status: DC | PRN

## 2023-06-07 MED ORDER — ACETAMINOPHEN 325 MG PO TABS: 650.0000 mg | ORAL_TABLET | ORAL | Status: DC | PRN

## 2023-06-07 MED ORDER — SENNOSIDES-DOCUSATE SODIUM 8.6-50 MG PO TABS
2.0000 | ORAL_TABLET | ORAL | Status: DC
  Administered 2023-06-07: 2 via ORAL
  Filled 2023-06-07 (×2): qty 2

## 2023-06-07 MED ORDER — BENZOCAINE-MENTHOL 20-0.5 % EX AERO: 1.0000 | INHALATION_SPRAY | CUTANEOUS | Status: DC | PRN

## 2023-06-07 MED ORDER — SODIUM CHLORIDE 0.9% FLUSH: 3.0000 mL | INTRAVENOUS | Status: DC | PRN

## 2023-06-07 MED ORDER — PRENATAL MULTIVITAMIN CH
1.0000 | ORAL_TABLET | Freq: Every day | ORAL | Status: DC
  Administered 2023-06-07: 1 via ORAL
  Filled 2023-06-07 (×2): qty 1

## 2023-06-07 MED ORDER — SODIUM CHLORIDE 0.9% FLUSH
3.0000 mL | Freq: Two times a day (BID) | INTRAVENOUS | Status: DC
  Administered 2023-06-08: 3 mL via INTRAVENOUS

## 2023-06-07 NOTE — Anesthesia Postprocedure Evaluation (Signed)
 Anesthesia Post Note  Patient: Brooke Salas  Procedure(s) Performed: AN AD HOC LABOR EPIDURAL     Patient location during evaluation: Mother Baby Anesthesia Type: Epidural Level of consciousness: awake Pain management: satisfactory to patient Vital Signs Assessment: post-procedure vital signs reviewed and stable Respiratory status: spontaneous breathing Cardiovascular status: stable Anesthetic complications: no  No notable events documented.  Last Vitals:  Vitals:   06/07/23 0952 06/07/23 1142  BP: 122/80 120/74  Pulse: 72 75  Resp: 16 16  Temp: 36.9 C 36.7 C  SpO2:      Last Pain:  Vitals:   06/07/23 1142  TempSrc: Oral  PainSc: 0-No pain   Pain Goal:                   [employer]

## 2023-06-07 NOTE — Lactation Note (Signed)
 This note was copied from a baby's chart. Lactation Consultation Note  Patient Name: Brooke Salas ZOXWR'U Date: 06/07/2023 Age:31 hours Reason for consult: Initial assessment;1st time breastfeeding;Early term 37-38.6wks;Breastfeeding assistance (HSV)  P2- MOB plans to offer both breast milk and formula to infant. MOB reports that infant had nursed well immediately after delivery, but he is not wanting to eat anymore. MOB also reports that infant has had multiple large episodes of emesis that contained mucus. LC offered to assist with a breastfeeding. MOB consented. Despite our efforts in stimulation, infant would not wake up to nurse. LC then offered to demonstrate hand expression. MOB consented. MOB's breasts are sensitive at this time and she requested to stop. No colostrum noted at this time. LC then provided MOB with formula to offer to infant. MOB was unable to get infant to drink. LC offered to assist and was able to get infant to drink 6 mL of formula within 15 minutes of trying. Infant proceeded to gag, so LC decided to stop at 6 mL.  LC reviewed the first 24 hr birthday nap, feeding infant on cue 8-12x in 24 hrs, not allowing infant to go over 3 hrs without a feeding, CDC milk storage guidelines, LC services handout and engorgement/breast care. LC encouraged MOB to call for further assistance as needed. RN updated on feeding.  Maternal Data Has patient been taught Hand Expression?: Yes Does the patient have breastfeeding experience prior to this delivery?: No  Feeding Mother's Current Feeding Choice: Breast Milk and Formula Nipple Type: Nfant Standard Flow (white)  LATCH Score Latch: Too sleepy or reluctant, no latch achieved, no sucking elicited.  Audible Swallowing: None  Type of Nipple: Everted at rest and after stimulation  Comfort (Breast/Nipple): Soft / non-tender  Hold (Positioning): Full assist, staff holds infant at breast  LATCH Score: 4   Lactation Tools  Discussed/Used Pump Education: Milk Storage  Interventions Interventions: Breast feeding basics reviewed;Assisted with latch;Hand express;Breast compression;Adjust position;Support pillows;Position options;Education;Pace feeding;LC Services brochure  Discharge Discharge Education: Engorgement and breast care;Warning signs for feeding baby Pump: Advised to call insurance company  Consult Status Consult Status: Follow-up Date: 06/08/23 Follow-up type: In-patient    Dema Severin BS, IBCLC 06/07/2023, 5:06 PM

## 2023-06-07 NOTE — Progress Notes (Signed)
 Labor Progress Note Brooke Salas is a 34 y.o. G2P1001 at [redacted]w[redacted]d presented for SROM this morning.   S: In due to decel, FHR in 90s-100s, pt on back.  O:  BP 124/73   Pulse 82   Temp 98.2 F (36.8 C) (Oral)   Resp 15   Ht 5\' 5"  (1.651 m)   Wt 83.2 kg   SpO2 100%   BMI 30.54 kg/m  EFM: 135/mod/-a/+prolonged (3-38min)  CVE: Dilation: 5 Effacement (%): 100 Station: -1 Presentation: Vertex Exam by:: Mary [place] Johnson, RNc-OB   A&P: 33 y.o. G2P1001 [redacted]w[redacted]d presenting for SROM.  #Labor: Pit paused and Terbutaline given to allow for fetal recovery, suspect 2/2 tachysystole. Will allow fetal recovery x 30-45 min and if ctx space out and reassuring fetal status, may resume Pitocin. #Pain: Epidural #FWB: Cat 2 #GBS negative #Resolved Polyhydramnios  #Marijuana Use in Pregnancy #Tobacco Use in pregnancy: Started on Nicotine replacement 12/2022, +UDS for nicotine 3/6  #HSV: on suppresion w/o recent outbreak, negative speculum exam on admission; continue Valtrex  [name], MD Center for [employer], [facility] 1:35 AM

## 2023-06-07 NOTE — Discharge Summary (Signed)
 Postpartum Discharge Summary  Date of Service updated***     Patient Name: Brooke Salas DOB: 12/23/1990 MRN: [id number]  Date of admission: 06/06/2023 Delivery date:06/07/2023 Delivering provider:   Date of discharge: 06/07/2023  Admitting diagnosis: Leakage of amniotic fluid [O42.90] Pregnant [Z34.90] Intrauterine pregnancy: [redacted]w[redacted]d     Secondary diagnosis:  Principal Problem:   SVD (spontaneous vaginal delivery) Active Problems:   Leakage of amniotic fluid   Pregnant  Additional problems: ***    Discharge diagnosis: Term Pregnancy Delivered                                              Post partum procedures:{Postpartum procedures:23558} Augmentation: Pitocin Complications: {OB Labor/Delivery Complications:20784}  Hospital course: Onset of Labor With Vaginal Delivery      33 y.o. yo G2P1001 at [redacted]w[redacted]d was admitted for SROM on 06/06/2023. Labor course was complicated by deep decelerations, improved w pausing Pitocin and position changes. Membrane Rupture Time/Date: 5:00 AM,06/06/2023  Delivery Method:Vaginal, Spontaneous Operative Delivery:N/A Episiotomy: None Lacerations:    Patient had a postpartum course complicated by ***.  She is ambulating, tolerating a regular diet, passing flatus, and urinating well. Patient is discharged home in stable condition on 06/07/23.  Newborn Data: Birth date:06/07/2023 Birth time:7:54 AM Gender:Female Living status:  Apgars: ,  Weight:   Magnesium Sulfate received: {Mag received:30440022} BMZ received: No Rhophylac:N/A MMR:N/A T-DaP:Given prenatally Flu: No RSV Vaccine received: No Transfusion:{Transfusion received:30440034}  Immunizations received:  There is no immunization history on file for this patient.  Physical exam  Vitals:   06/07/23 0623 06/07/23 0631 06/07/23 0635 06/07/23 0700  BP:  (!) 148/84 121/86 (!) 134/94  Pulse:  74 76 76  Resp:      Temp: 97.9 F (36.6 C)     TempSrc: Oral     SpO2:      Weight:       Height:       General: {Exam; general:21111117} Lochia: {Desc; appropriate/inappropriate:30686::"appropriate"} Uterine Fundus: {Desc; firm/soft:30687} Incision: {Exam; incision:21111123} DVT Evaluation: {Exam; dvt:2111122} Labs: Lab Results  Component Value Date   WBC 11.5 (H) 06/06/2023   HGB 12.0 06/06/2023   HCT 35.1 (L) 06/06/2023   MCV 98.0 06/06/2023   PLT 217 06/06/2023      Latest Ref Rng & Units 06/06/2023    2:30 PM  CMP  Glucose 70 - 99 mg/dL 81   BUN 6 - 20 mg/dL 9   Creatinine 9.14 - 7.82 mg/dL 9.56   Sodium 213 - 086 mmol/L 135   Potassium 3.5 - 5.1 mmol/L 3.8   Chloride 98 - 111 mmol/L 104   CO2 22 - 32 mmol/L 19   Calcium 8.9 - 10.3 mg/dL 8.8   Total Protein 6.5 - 8.1 g/dL 6.0   Total Bilirubin 0.0 - 1.2 mg/dL 0.4   Alkaline Phos 38 - 126 U/L 100   AST 15 - 41 U/L 26   ALT 0 - 44 U/L 21    Edinburgh Score:     No data to display         No data recorded  After visit meds:  Allergies as of 06/07/2023   No Known Allergies   Med Rec must be completed prior to using this [facility]***        Discharge home in stable condition Infant Feeding: {Baby feeding:23562} Infant Disposition:{CHL IP OB  HOME WITH FAOZHY:86578} Discharge instruction: per After Visit Summary and Postpartum booklet. Activity: Advance as tolerated. Pelvic rest for 6 weeks.  Diet: {OB IONG:29528413} Future Appointments:No future appointments. Follow up Visit: Message sent to [facility] 3/22  Please schedule this patient for a In person postpartum visit in 6 weeks with the following provider: Any provider. Additional Postpartum F/U: None   High risk pregnancy complicated by: insufficient prenatal care, marijuana/tobacco use, HSV Delivery mode:  Vaginal, Spontaneous Anticipated Birth Control:  Condoms   06/07/2023 [name], MD

## 2023-06-07 NOTE — Lactation Note (Signed)
 This note was copied from a baby's chart. Lactation Consultation Note  Patient Name: Brooke Salas ZOXWR'U Date: 06/07/2023 Age:31 hours Reason for consult: Mother's request;Early term 85-38.6wks Mom called for latch assistance. Baby had emesis when LC came in. LC cleared mouth w/bulb syringe. Explained to mom as others has that w/baby spitting up like he is doing he doesn't want to eat. Encouraged mom to try again in 3 hrs unless baby cues to feed before then.  Maternal Data Has patient been taught Hand Expression?: Yes Does the patient have breastfeeding experience prior to this delivery?: No  Feeding    LATCH Score Latch: Too sleepy or reluctant, no latch achieved, no sucking elicited.  Audible Swallowing: None  Type of Nipple: Everted at rest and after stimulation  Comfort (Breast/Nipple): Soft / non-tender  Hold (Positioning): Full assist, staff holds infant at breast  LATCH Score: 4   Lactation Tools Discussed/Used    Interventions Interventions: Education  Discharge    Consult Status Consult Status: Follow-up Date: 06/08/23 Follow-up type: In-patient    Charyl Dancer 06/07/2023, 10:33 PM

## 2023-06-07 NOTE — Progress Notes (Signed)
 Labor Progress Note Brooke Salas is a 33 y.o. G2P1001 at [redacted]w[redacted]d presented for SROM yesterday morning.   Pt reports feeling 10/10 pressure in bottom. Cat I strip. Cx rechecked and now complete, +1 station. Pt does not feel desire to push, would like family in room so will allow pt to labor down and will commence pushing if pt begins feeling urge to push or on arrival of family members. Pt understanding and agreeable to plan.  [name], MD Center for [facility], [facility] 6:55 AM

## 2023-06-08 MED ORDER — IBUPROFEN 800 MG PO TABS: 800.0000 mg | ORAL_TABLET | Freq: Three times a day (TID) | ORAL | 0 refills | Status: DC | PRN

## 2023-06-08 NOTE — Lactation Note (Signed)
 This note was copied from a baby's chart. Lactation Consultation Note  Patient Name: Brooke Salas ZOXWR'U Date: 06/08/2023 Age:31 hours Reason for consult: Mother's request;Early term 49-38.6wks RN called LC stating mom was trying to latch baby and he wasn't interested.  LC got baby stimulated and got him latched.  Baby sleepy. Rm. On 78 degrees.LC lowered rm. Temperature explaining to mom if it is to warm the baby will not want to wakeup. Mom stated understood. LC talked mom into latching and done little hands on to make mom feel more confident to what happen. Encourage mom to call for assistance as needed. Praised mom for good latch.  Maternal Data    Feeding    LATCH Score Latch: Grasps breast easily, tongue down, lips flanged, rhythmical sucking.  Audible Swallowing: None  Type of Nipple: Everted at rest and after stimulation  Comfort (Breast/Nipple): Soft / non-tender  Hold (Positioning): Assistance needed to correctly position infant at breast and maintain latch.  LATCH Score: 7   Lactation Tools Discussed/Used    Interventions Interventions: Breast feeding basics reviewed;Assisted with latch;Breast massage;Breast compression;Adjust position;Support pillows;Education  Discharge    Consult Status Consult Status: Follow-up Date: 06/08/23 Follow-up type: In-patient    Charyl Dancer 06/08/2023, 3:23 AM

## 2023-06-08 NOTE — Progress Notes (Addendum)
 Post Partum Day #1 Subjective: no complaints, up ad lib, and tolerating PO; breast and bottle feeding; she desires a circumcision for her son- she was consented and a note placed in his chart  Objective: Blood pressure 117/87, pulse 80, temperature 97.6 F (36.4 C), temperature source Oral, resp. rate 18, height 5\' 5"  (1.651 m), weight 83.2 kg, SpO2 99%, unknown if currently breastfeeding.  Physical Exam:  General: alert, cooperative, and no distress Lochia: appropriate Uterine Fundus: firm DVT Evaluation: No evidence of DVT seen on physical exam.  Recent Labs    06/06/23 1430  HGB 12.0  HCT 35.1*    Assessment/Plan: Discharge home per her request for early d/c as long as the baby can go (see Discharge Summary).   LOS: 2 days   Brooke Salas, CNM 06/08/2023, 8:49 AM

## 2023-06-08 NOTE — Clinical Social Work Maternal (Signed)
 CLINICAL SOCIAL WORK MATERNAL/CHILD NOTE  Patient Details  Name: Brooke Salas MRN: [id number] Date of Birth: 1990/12/14  Date:  06/08/2023  Clinical Social Worker Initiating Note:  Brooke Salas, [place] Date/Time: Initiated:  06/08/23/1156     Child's Name:  Brooke Salas   Biological Parents:  Mother   Need for Interpreter:  None   Reason for Referral:  Current Substance Use/Substance Use During Pregnancy     Address:  [name]    Phone number:  [phone] (home)     Additional phone number:   Household Members/Support Persons (HM/SP):   Household Member/Support Person 1, Household Member/Support Person 2   HM/SP Name Relationship DOB or Age  HM/SP -1 Brooke Salas MOB's mother    HM/SP -2 Brooke Salas Daughter 08/06/2009  HM/SP -3        HM/SP -4        HM/SP -5        HM/SP -6        HM/SP -7        HM/SP -8          Natural Supports (not living in the home):      Professional Supports: None   Employment: Full-time   Type of Work: Paediatric nurse:  Engineer, maintenance (IT)   Homebound arranged:    Surveyor, quantity Resources:  Medicaid   Other Resources:  Sales executive  , WIC   Cultural/Religious Considerations Which May Impact Care:    Strengths:  Ability to meet basic needs  , Home prepared for child  , Pediatrician chosen   Psychotropic Medications:         Pediatrician:    [place] area  Pediatrician List:   Brooke Salas  Brooke Salas     Brooke Salas      Pediatrician Fax Number:    Risk Factors/Current Problems:  Substance Use     Cognitive State:  Linear Thinking  , Able to Concentrate  , Alert  , Goal Oriented     Mood/Affect:  Calm  , Comfortable  , Relaxed  , Interested     CSW Assessment: CSW was consulted due to maternal use of marijuana during pregnancy. CSW met with MOB at bedside to complete assessment. When  CSW entered room, MOB was observed sitting in Salas bed, infant was asleep on his back in bassinet. MOB had a female visitor present. CSW introduced self and requested to speak with MOB alone. MOB provided verbal consent to continue with consult with visitor present. CSW explained reason for visit. MOB presented as calm, was agreeable to consult and remained engaged throughout encounter.   MOB confirmed demographic information listed in chart. CSW inquired about MOB's mental health history. MOB denied a history of mental health symptoms/diagnoses and denied a history of postpartum depression after the birth of her daughter in 2011. CSW inquired about supports. MOB identified her mother, with whom she lives as a support. MOB denied current SI/HI.   CSW provided education regarding the baby blues period vs. perinatal mood disorders, discussed treatment and gave resources for mental health follow up if concerns arise. CSW recommends self-evaluation during the postpartum time period using the New Mom Checklist from Postpartum Progress and encouraged MOB to contact a medical professional if symptoms are noted at any time.    MOB reports she has a car seat, diapers, and  clothing for infant. CSW inquired about a crib or bassinet. MOB reported that she planned for infant to sleep in the bed with her. CSW strongly advised against co-sleeping and provided education about safe sleep and Sudden Infant Death Syndrome (SIDS) precautions. CSW inquired if MOB has a crib or bassinet at home. MOB reported she does not. CSW offered to provide a pack n play for infant. MOB agreeable, CSW brought pack n play to room.  CSW inquired about tobacco use during pregnancy. MOB acknowledged that she does smoke cigarettes. CSW provided education about how exposure to second hand smoke can increase the risk of SIDS. CSW inquired if MOB is interested in smoking cessation information. MOB expressed interest, CSW provided MOB with  [employer].  CSW informed MOB about [place] drug screen policy due to maternal UDS positive for [facility] 01/02/2023. CSW explained that infant's UDS and CDS would be monitored and a CPS report would be made if warranted. MOB expressed understanding. CSW inquired about substance use during pregnancy. MOB reported that she smoked marijuana in the beginning of her pregnancy. MOB denied using other illicit substances during pregnancy. CSW inquired if MOB has had prior CPS involvement, MOB denied. CSW inquired about transportation barriers, MOB reports she has transportation. CSW inquired about additional resource needs, MOB declined additional needs.  CSW identifies no further need for intervention and no barriers to discharge at this time.  CSW Plan/Description:  No Further Intervention Required/No Barriers to Discharge, Sudden Infant Death Syndrome (SIDS) Education, Perinatal Mood and Anxiety Disorder (PMADs) Education, [place] Drug Screen Policy Information, Other Information/Referral to [facility], CSW Will Continue to Monitor Umbilical Cord Tissue Drug Screen Results and Make Report if Brooke Salas, LCSWA 06/08/2023, 11:59 AM

## 2023-06-08 NOTE — Lactation Note (Signed)
 This note was copied from a baby's chart. Lactation Consultation Note  Patient Name: Brooke Salas ZOXWR'U Date: 06/08/2023 Age:31 hours Reason for consult: Follow-up assessment;Early term 44-38.6wks  P2, Mother is breastfeeding and formula feeding.  Baby recently breastfed for 30 min and is sleeping.  Mother states she has had more difficulty latching on the L side than the right side. Suggest mother prepump with manual pump and hand express before latching. Feed on demand with cues.  Goal 8-12+ times per day after first 24 hrs.  Place baby STS if not cueing.  Offer breast before formula. Reviewed engorgement care and monitoring voids/stools.  Maternal Data Has patient been taught Hand Expression?: Yes  Feeding Mother's Current Feeding Choice: Breast Milk and Formula  Lactation Tools Discussed/Used Tools: Flanges;Pump Breast pump type: Manual Pump Education: Setup, frequency, and cleaning Reason for Pumping: Prepump Pumping frequency: PRN  Interventions Interventions: Education  Discharge Discharge Education: Engorgement and breast care;Warning signs for feeding baby  Consult Status Consult Status: Complete Date: 06/08/23   Hardie Pulley  RN, IBCLC 06/08/2023, 11:45 AM

## 2023-06-19 ENCOUNTER — Telehealth (HOSPITAL_COMMUNITY): Payer: Self-pay | Admitting: *Deleted

## 2023-06-19 NOTE — Telephone Encounter (Signed)
 06/19/2023  Name: Brooke Salas MRN: [id number] DOB: [birth year]  Reason for Call:  Transition of Care Hospital Discharge Call  Contact Status: Patient Contact Status: Complete  Language assistant needed:          Follow-Up Questions: Do You Have Any Concerns About Your Health As You Heal From Delivery?: No Do You Have Any Concerns About Your Infants Health?: No  Edinburgh Postnatal Depression Scale:  In the Past 7 Days:   Declined, stating, "I feel fine." PHQ2-9 Depression Scale:     Discharge Follow-up: Edinburgh score requires follow up?: N/A Patient was advised of the following resources:: Breastfeeding Support Group, Support Group  Post-discharge interventions: Reviewed Newborn Safe Sleep Practices  Signature Deforest Hoyles, RN, 06/19/23, [phone]

## 2023-07-22 ENCOUNTER — Ambulatory Visit: Admitting: Obstetrics and Gynecology

## 2023-09-02 ENCOUNTER — Other Ambulatory Visit: Payer: Self-pay

## 2023-09-02 ENCOUNTER — Ambulatory Visit (HOSPITAL_COMMUNITY)
Admission: EM | Admit: 2023-09-02 | Discharge: 2023-09-02 | Disposition: A | Discharge status: Home / Self Care | Attending: Family Medicine | Admitting: Family Medicine

## 2023-09-02 ENCOUNTER — Encounter (HOSPITAL_COMMUNITY): Payer: Self-pay | Admitting: *Deleted

## 2023-09-02 DIAGNOSIS — R300 Dysuria: Secondary | ICD-10-CM | POA: Diagnosis not present

## 2023-09-02 DIAGNOSIS — N3090 Cystitis, unspecified without hematuria: Secondary | ICD-10-CM | POA: Diagnosis not present

## 2023-09-02 DIAGNOSIS — N926 Irregular menstruation, unspecified: Secondary | ICD-10-CM | POA: Diagnosis present

## 2023-09-02 LAB — POCT URINALYSIS DIP (MANUAL ENTRY)
Bilirubin, UA: NEGATIVE
Glucose, UA: NEGATIVE mg/dL
Ketones, POC UA: NEGATIVE mg/dL
Nitrite, UA: POSITIVE — AB
Protein Ur, POC: >=300 mg/dL — AB
Spec Grav, UA: >=1.03 — AB (ref 1.010–1.025)
Urobilinogen, UA: 1 U/dL
pH, UA: 6 (ref 5.0–8.0)

## 2023-09-02 LAB — POCT URINE PREGNANCY: Preg Test, Ur: NEGATIVE

## 2023-09-02 MED ORDER — NITROFURANTOIN MONOHYD MACRO 100 MG PO CAPS: 100.0000 mg | ORAL_CAPSULE | Freq: Two times a day (BID) | ORAL | 0 refills | Status: DC

## 2023-09-02 MED ORDER — IBUPROFEN 600 MG PO TABS: 600.0000 mg | ORAL_TABLET | Freq: Three times a day (TID) | ORAL | 0 refills | Status: DC | PRN

## 2023-09-02 NOTE — Discharge Instructions (Addendum)
 The urinalysis is abnormal, with nitrites, white blood cells and red blood cells, consistent with a bladder infection.  Take nitrofurantoin  100 mg--1 capsule 2 times daily for 5 days; this is safe in breast-feeding.  Take ibuprofen  600 mg--1 tab every 8 hours as needed for pain.  This is also safe in breast-feeding.  Make sure you are drinking plenty of fluids  Staff will notify you if there is anything positive on the swab.  Urine culture is sent, and staff will notify you that it looks like the antibiotic needs to be changed.

## 2023-09-02 NOTE — ED Triage Notes (Signed)
 Pt reports pain only when she voids for 2-3 day.

## 2023-09-02 NOTE — ED Provider Notes (Signed)
 [facility]    CSN: [id number] Arrival date & time: 09/02/23  1653      History   Chief Complaint Chief Complaint  Patient presents with   Dysuria    HPI Brooke Salas is a 33 y.o. female.    Dysuria Here for pain in her perineum when she urinates.  She does not really describe it as a burning.  There is some frequency.  The pain is at the end of urination.  She would like testing for STDs.  No vaginal discharge  She is not allergic to any medicines  Last menstrual cycle began today but is spotting and then it had been a couple of months before she had had another.  She is on oral contraceptives and sometimes does miss doses.    She has nursing    Past Medical History:  Diagnosis Date   Herpes    Hypokalemia 05/18/2013   Nausea & vomiting 05/18/2013   PNA (pneumonia) 05/18/2013    Patient Active Problem List   Diagnosis Date Noted   SVD (spontaneous vaginal delivery) 06/07/2023   Leakage of amniotic fluid 06/06/2023   Pregnant 06/06/2023   Polyhydramnios in second trimester 03/05/2023   Tobacco abuse 02/28/2023   Marijuana smoker 02/28/2023   HSV (herpes simplex virus) anogenital infection 02/28/2023   Insufficient prenatal care in second trimester 02/28/2023   Sinus tachycardia 05/18/2013    Past Surgical History:  Procedure Laterality Date   NO PAST SURGERIES      OB History     Gravida  2   Para  2   Term  2   Preterm      AB      Living  2      SAB      IAB      Ectopic      Multiple  0   Live Births  2            Home Medications    Prior to Admission medications   Medication Sig Start Date End Date Taking? Authorizing Provider  ibuprofen  (ADVIL ) 600 MG tablet Take 1 tablet (600 mg total) by mouth every 8 (eight) hours as needed (pain). 09/02/23  Yes Adean Milosevic, Paige Boatman, MD  nitrofurantoin , macrocrystal-monohydrate, (MACROBID ) 100 MG capsule Take 1 capsule (100 mg total) by mouth 2 (two) times daily.  09/02/23  Yes Vernie Piet, Paige Boatman, MD  Prenatal Vit-Fe Fumarate-FA (MULTIVITAMIN-PRENATAL) 27-0.8 MG TABS tablet Take 1 tablet by mouth daily at 12 noon.   Yes [provider]    Family History Family History  Problem Relation Age of Onset   Hypertension Mother    Heart disease Mother    Diabetes Mother    Cancer Maternal Aunt    Hypertension Maternal Grandmother    Diabetes Maternal Grandmother    Asthma Neg Hx     Social History Social History   Tobacco Use   Smoking status: Every Day    Current packs/day: 1.00    Types: Cigarettes   Smokeless tobacco: Never  Substance Use Topics   Alcohol use: No   Drug use: Never     Allergies   Patient has no known allergies.   Review of Systems Review of Systems  Genitourinary:  Positive for dysuria.     Physical Exam Triage Vital Signs ED Triage Vitals  Encounter Vitals Group     BP 09/02/23 1711 122/85     Girls Systolic BP Percentile --  Girls Diastolic BP Percentile --      Boys Systolic BP Percentile --      Boys Diastolic BP Percentile --      Pulse Rate 09/02/23 1711 98     Resp 09/02/23 1711 18     Temp 09/02/23 1711 98.1 F (36.7 C)     Temp src --      SpO2 09/02/23 1711 97 %     Weight --      Height --      Head Circumference --      Peak Flow --      Pain Score 09/02/23 1708 10     Pain Loc --      Pain Education --      Exclude from Growth Chart --    No data found.  Updated Vital Signs BP 122/85   Pulse 98   Temp 98.1 F (36.7 C)   Resp 18   LMP 09/02/2023 Comment: BABA 3 months ago  SpO2 97%   Breastfeeding Yes   Visual Acuity Right Eye Distance:   Left Eye Distance:   Bilateral Distance:    Right Eye Near:   Left Eye Near:    Bilateral Near:     Physical Exam Vitals reviewed.  Constitutional:      General: She is not in acute distress.    Appearance: She is not ill-appearing, toxic-appearing or diaphoretic.  HENT:     Mouth/Throat:     Mouth: Mucous membranes  are moist.   Eyes:     Extraocular Movements: Extraocular movements intact.     Pupils: Pupils are equal, round, and reactive to light.    Cardiovascular:     Rate and Rhythm: Normal rate and regular rhythm.     Heart sounds: No murmur heard. Pulmonary:     Effort: Pulmonary effort is normal.     Breath sounds: Normal breath sounds.  Abdominal:     General: There is no distension.     Palpations: There is no mass.     Tenderness: There is no abdominal tenderness.   Musculoskeletal:     Cervical back: Neck supple.  Lymphadenopathy:     Cervical: No cervical adenopathy.   Skin:    Coloration: Skin is not pale.   Neurological:     General: No focal deficit present.     Mental Status: She is alert and oriented to person, place, and time.   Psychiatric:        Behavior: Behavior normal.      UC Treatments / Results  Labs (all labs ordered are listed, but only abnormal results are displayed) Labs Reviewed  POCT URINALYSIS DIP (MANUAL ENTRY) - Abnormal; Notable for the following components:      Result Value   Clarity, UA cloudy (*)    Spec Grav, UA >=1.030 (*)    Blood, UA large (*)    Protein Ur, POC >=300 (*)    Nitrite, UA Positive (*)    Leukocytes, UA Small (1+) (*)    All other components within normal limits  URINE CULTURE  POCT URINE PREGNANCY  CERVICOVAGINAL ANCILLARY ONLY    EKG   Radiology No results found.  Procedures Procedures (including critical care time)  Medications Ordered in UC Medications - No data to display  Initial Impression / Assessment and Plan / UC Course  I have reviewed the triage vital signs and the nursing notes.  Pertinent labs & imaging results that were available during  my care of the patient were reviewed by me and considered in my medical decision making (see chart for details).     UPT is negative   urinalysis is positive with a large amount of leukocytes and red blood cells and nitrites.  Macrobid  is sent in  to treat the UTI.  It is considered safe in breast-feeding for infants over 1 month of age.  Pyridium  is NOT sent since she is breast-feeding.  Vaginal self swab is done, and we will notify of any positives on that and treat per protocol.  Advil  sent in for pain. Final Clinical Impressions(s) / UC Diagnoses   Final diagnoses:  Cystitis  Dysuria  Irregular menses     Discharge Instructions      The urinalysis is abnormal, with nitrites, white blood cells and red blood cells, consistent with a bladder infection.  Take nitrofurantoin  100 mg--1 capsule 2 times daily for 5 days; this is safe in breast-feeding.  Take ibuprofen  600 mg--1 tab every 8 hours as needed for pain.  This is also safe in breast-feeding.  Make sure you are drinking plenty of fluids  Staff will notify you if there is anything positive on the swab.  Urine culture is sent, and staff will notify you that it looks like the antibiotic needs to be changed.      ED Prescriptions     Medication Sig Dispense Auth. Provider   nitrofurantoin , macrocrystal-monohydrate, (MACROBID ) 100 MG capsule Take 1 capsule (100 mg total) by mouth 2 (two) times daily. 10 capsule Ann Keto, MD   ibuprofen  (ADVIL ) 600 MG tablet Take 1 tablet (600 mg total) by mouth every 8 (eight) hours as needed (pain). 15 tablet Ezinne Yogi K, MD      PDMP not reviewed this encounter.   Ann Keto, MD 09/02/23 2027

## 2023-09-03 LAB — CERVICOVAGINAL ANCILLARY ONLY
Chlamydia: NEGATIVE
Comment: NEGATIVE
Comment: NEGATIVE
Comment: NORMAL
Neisseria Gonorrhea: NEGATIVE
Trichomonas: NEGATIVE

## 2023-09-04 ENCOUNTER — Ambulatory Visit (HOSPITAL_COMMUNITY): Payer: Self-pay

## 2023-09-04 LAB — URINE CULTURE: Culture: >=100000 — AB

## 2023-10-12 ENCOUNTER — Inpatient Hospital Stay (HOSPITAL_COMMUNITY)
Admission: AD | Admit: 2023-10-12 | Discharge: 2023-10-12 | Disposition: A | Discharge status: Home / Self Care | Attending: Obstetrics and Gynecology | Admitting: Obstetrics and Gynecology

## 2023-10-12 ENCOUNTER — Other Ambulatory Visit: Payer: Self-pay

## 2023-10-12 DIAGNOSIS — Z789 Other specified health status: Secondary | ICD-10-CM

## 2023-10-12 DIAGNOSIS — N912 Amenorrhea, unspecified: Secondary | ICD-10-CM | POA: Diagnosis present

## 2023-10-12 LAB — HCG, QUANTITATIVE, PREGNANCY: hCG, Beta Chain, Quant, S: <1 m[IU]/mL (ref <5)

## 2023-10-12 NOTE — MAU Note (Signed)
 Brooke Salas is a 33 y.o. at Unknown here in MAU reporting: feeling sick and nauseated with lower abdominal cramping that started at the beginning of this month. Denies any VB. Has taken 3 HPT and they were all negative. Here for confirmation. States she had a baby 4 months ago but unsure if she has had a true period yet and stopped taking BC pills 1 month ago.   LMP: last month doesn't know date    Pain score: 0 currently  Vitals:   10/12/23 1612  BP: 116/79  Pulse: 96  Resp: 17  Temp: 98.6 F (37 C)  SpO2: 100%      Lab orders placed from triage:

## 2023-10-12 NOTE — MAU Provider Note (Signed)
 None     S [name] is a 33 y.o. [phone] non-pregnant female at Unknown who presents to MAU today with complaint of evaluation for pregnancy. She Reports she is unsure of dates of LMP but she bled for several days sometime last month. She had symptoms of nausea that started sometime around the middle of this month. She denies VB, states only minor cramping, no pain now. She has had 3 negative HPT and a negative UPT here. Was taking POPs which she stopped approximately 1 month ago.   Has not established care, unconfirmed pregnancy.   Pertinent items noted in HPI and remainder of comprehensive ROS otherwise negative.   O BP 116/79 (BP Location: Right Arm)   Pulse 96   Temp 98.6 F (37 C) (Oral)   Resp 17   Ht 5' 5 (1.651 m)   Wt 78.4 kg   SpO2 100%   BMI 28.76 kg/m  Physical Exam Vitals reviewed.  Constitutional:      Appearance: Normal appearance.  HENT:     Head: Normocephalic.  Cardiovascular:     Rate and Rhythm: Normal rate and regular rhythm.     Pulses: Normal pulses.     Heart sounds: Normal heart sounds.  Pulmonary:     Effort: Pulmonary effort is normal.     Breath sounds: Normal breath sounds.  Skin:    General: Skin is warm and dry.     Capillary Refill: Capillary refill takes less than 2 seconds.  Neurological:     General: No focal deficit present.     Mental Status: She is alert and oriented to person, place, and time.      MDM: Will perform betaHcG here, UPT negative.  Unclear history regarding when/if she's had a true LMP, when she stopped POPs. Confirmed we have a good contact number and will call her for next steps if her betaHcG is positive for pregnancy Quant beta is <1, patient is definitively not pregnant.  MAU Course:  A Amenorrhea - Plan: Discharge patient  Not currently pregnant  Medical screening exam complete  P Discharge from MAU in stable condition with routine precautions Called patient with negative pregnancy  results and recommendation to Follow up at PCP for further complaints  Allergies as of 10/12/2023   No Known Allergies      Medication List     STOP taking these medications    nitrofurantoin  (macrocrystal-monohydrate) 100 MG capsule Commonly known as: MACROBID        TAKE these medications    ibuprofen  600 MG tablet Commonly known as: ADVIL  Take 1 tablet (600 mg total) by mouth every 8 (eight) hours as needed (pain).   multivitamin-prenatal 27-0.8 MG Tabs tablet Take 1 tablet by mouth daily at 12 noon.        Camie Rote, MSN, CNM 10/12/2023 5:27 PM  Certified Nurse Midwife, [facility]

## 2023-10-21 ENCOUNTER — Encounter (HOSPITAL_COMMUNITY): Payer: Self-pay | Admitting: *Deleted

## 2023-10-21 ENCOUNTER — Inpatient Hospital Stay (HOSPITAL_COMMUNITY)
Admission: AD | Admit: 2023-10-21 | Discharge: 2023-10-21 | Disposition: A | Discharge status: Home / Self Care | Attending: Family Medicine | Admitting: Family Medicine

## 2023-10-21 DIAGNOSIS — O3680X0 Pregnancy with inconclusive fetal viability, not applicable or unspecified: Secondary | ICD-10-CM | POA: Diagnosis not present

## 2023-10-21 DIAGNOSIS — O26851 Spotting complicating pregnancy, first trimester: Secondary | ICD-10-CM | POA: Diagnosis present

## 2023-10-21 DIAGNOSIS — Z3A00 Weeks of gestation of pregnancy not specified: Secondary | ICD-10-CM | POA: Diagnosis not present

## 2023-10-21 DIAGNOSIS — Z3490 Encounter for supervision of normal pregnancy, unspecified, unspecified trimester: Secondary | ICD-10-CM

## 2023-10-21 DIAGNOSIS — R109 Unspecified abdominal pain: Secondary | ICD-10-CM | POA: Diagnosis present

## 2023-10-21 LAB — POCT PREGNANCY, URINE: Preg Test, Ur: NEGATIVE

## 2023-10-21 LAB — HCG, QUANTITATIVE, PREGNANCY: hCG, Beta Chain, Quant, S: 6 m[IU]/mL — ABNORMAL HIGH (ref <5)

## 2023-10-21 NOTE — MAU Note (Addendum)
 Brooke Salas is a 33 y.o. at Unknown here in MAU reporting getting 2 positive upts on Sunday. Started spotting today and having some cramping. No pain currently. Labs drawn in Triage   LMP: unknown. Thinks sometime in June Onset of complaint: today Pain score: 0 Vitals:   10/21/23 1932  BP: 115/75  Pulse: 89  Resp: 17  Temp: 97.9 F (36.6 C)  SpO2: 100%     FHT: n/a  Lab orders placed from triage: upt already done and was negative

## 2023-10-21 NOTE — MAU Provider Note (Signed)
 History     CSN: [id number]  Arrival date and time: 10/21/23 1814   Event Date/Time   First Provider Initiated Contact with Patient 10/21/23 2116      Chief Complaint  Patient presents with   Vaginal Bleeding    33 y.o. [id number]  here with complaints of spotting with June LMP. UPT was positive at home.   Denies abdominal pain.   Vaginal Bleeding Pertinent negatives include no abdominal pain, back pain, chills, diarrhea, dysuria, fever, flank pain, nausea, rash, sore throat or vomiting.    OB History     Gravida  2   Para  2   Term  2   Preterm      AB      Living  2      SAB      IAB      Ectopic      Multiple  0   Live Births  2           Past Medical History:  Diagnosis Date   Herpes    Hypokalemia 05/18/2013   Nausea & vomiting 05/18/2013   PNA (pneumonia) 05/18/2013    Past Surgical History:  Procedure Laterality Date   NO PAST SURGERIES      Family History  Problem Relation Age of Onset   Hypertension Mother    Heart disease Mother    Diabetes Mother    Cancer Maternal Aunt    Hypertension Maternal Grandmother    Diabetes Maternal Grandmother    Asthma Neg Hx     Social History   Tobacco Use   Smoking status: Every Day    Current packs/day: 1.00    Types: Cigarettes   Smokeless tobacco: Never  Substance Use Topics   Alcohol use: No   Drug use: Never    Allergies: No Known Allergies  Medications Prior to Admission  Medication Sig Dispense Refill Last Dose/Taking   ibuprofen  (ADVIL ) 600 MG tablet Take 1 tablet (600 mg total) by mouth every 8 (eight) hours as needed (pain). 15 tablet 0    Prenatal Vit-Fe Fumarate-FA (MULTIVITAMIN-PRENATAL) 27-0.8 MG TABS tablet Take 1 tablet by mouth daily at 12 noon.       Review of Systems  Constitutional:  Negative for chills and fever.  HENT:  Negative for congestion and sore throat.   Eyes:  Negative for pain and visual disturbance.  Respiratory:  Negative for cough, chest  tightness and shortness of breath.   Cardiovascular:  Negative for chest pain.  Gastrointestinal:  Negative for abdominal pain, diarrhea, nausea and vomiting.  Endocrine: Negative for cold intolerance and heat intolerance.  Genitourinary:  Positive for vaginal bleeding. Negative for dysuria and flank pain.  Musculoskeletal:  Negative for back pain.  Skin:  Negative for rash.  Allergic/Immunologic: Negative for food allergies.  Neurological:  Negative for dizziness and light-headedness.  Psychiatric/Behavioral:  Negative for agitation.    Physical Exam   Blood pressure 115/75, pulse 89, temperature 97.9 F (36.6 C), resp. rate 17, height 5' 5 (1.651 m), weight 79.8 kg, SpO2 100%, currently breastfeeding.  Physical Exam Vitals and nursing note reviewed.  Constitutional:      Appearance: Normal appearance.  HENT:     Head: Normocephalic and atraumatic.     Nose: Nose normal.     Mouth/Throat:     Mouth: Mucous membranes are moist.  Eyes:     Conjunctiva/sclera: Conjunctivae normal.  Cardiovascular:     Rate and Rhythm: Normal rate.  Pulmonary:     Effort: Pulmonary effort is normal.  Abdominal:     General: Abdomen is flat.     Palpations: Abdomen is soft.  Musculoskeletal:     Cervical back: Normal range of motion.  Skin:    General: Skin is warm.     Capillary Refill: Capillary refill takes less than 2 seconds.  Neurological:     General: No focal deficit present.     Mental Status: She is alert.  Psychiatric:        Mood and Affect: Mood normal.     MAU Course  Procedures  MDM- moderate Pregnancy unknown location Home UPT positive, here urine was negative. Serum bhcg was positive at only 6.  Discussed repeat in ~48 hours to see progression of serum BHCG  Assessment and Plan   1. Early stage of pregnancy   2. Pregnancy of unknown anatomic location   3. Spotting affecting pregnancy in first trimester    - Discharge stable condition - Repeat BHCG in 48 hr,  appointment made for patient  Future Appointments  Date Time Provider Department Center  10/24/2023 10:35 AM [facility] FU [facility] [facility]     Suzen Octave Aarik Blank 10/21/2023, 9:16 PM

## 2023-10-21 NOTE — Progress Notes (Signed)
 Dr Eldonna in to see pt and discuss test results and d/c plan. WRitten and verbal d/c instructions given by MD and pt then d/c home.

## 2023-10-21 NOTE — Discharge Instructions (Signed)
 You were seen in the maternity assessment unit with the concern for spotting in early pregnancy.  Your urine pregnancy test here was negative.  However we did a blood pregnancy test that was slightly positive.  The threshold for something being positive for this test is greater than 5 and your value was 6.  This means you may be very early pregnant.  We recommend that you have a repeat blood pregnancy test in approximately 48 hours to see if the pregnancy hormone is rising or falling.  [facility] again on Friday  Return to the MAU with heavy vaginal bleeding that fills up more than a pad every hour, severe abdominal pain or any other pregnancy related concern

## 2023-10-24 ENCOUNTER — Ambulatory Visit: Payer: Self-pay | Admitting: Obstetrics and Gynecology

## 2023-10-24 ENCOUNTER — Other Ambulatory Visit: Payer: Self-pay

## 2023-10-24 ENCOUNTER — Ambulatory Visit (INDEPENDENT_AMBULATORY_CARE_PROVIDER_SITE_OTHER): Payer: Self-pay

## 2023-10-24 VITALS — BP 118/78 | HR 89 | Ht 65.0 in | Wt 172.0 lb

## 2023-10-24 DIAGNOSIS — O039 Complete or unspecified spontaneous abortion without complication: Secondary | ICD-10-CM

## 2023-10-24 DIAGNOSIS — Z3A00 Weeks of gestation of pregnancy not specified: Secondary | ICD-10-CM

## 2023-10-24 LAB — BETA HCG QUANT (REF LAB): hCG Quant: <2 m[IU]/mL

## 2023-10-24 NOTE — Progress Notes (Signed)
 [name] presents to [facility] for follow-up quant hCG blood draw today. She was seen in MAU for vaginal bleeding on 10/21/2023. Patient endorses bleeding today, currently denies pain. Discussed with patient, we are following hCG levels today. Results will be back in approximately 2 hours.   Beta HCG results: 10/12/23 <1  10/21/23 6  10/24/23 PEND    Report given to second attending Dr. Alger. Pt informed she will be notified via MyChart with results and follow-up plan. Pt denies any further questions or concerns.   Cyndee JAYSON Molt 10/24/2023 8:23 AM

## 2023-12-08 ENCOUNTER — Ambulatory Visit (INDEPENDENT_AMBULATORY_CARE_PROVIDER_SITE_OTHER): Payer: Self-pay

## 2023-12-08 ENCOUNTER — Encounter (HOSPITAL_COMMUNITY): Payer: Self-pay | Admitting: Emergency Medicine

## 2023-12-08 ENCOUNTER — Ambulatory Visit (HOSPITAL_COMMUNITY)
Admission: EM | Admit: 2023-12-08 | Discharge: 2023-12-08 | Disposition: A | Discharge status: Home / Self Care | Payer: Self-pay | Attending: Student | Admitting: Student

## 2023-12-08 DIAGNOSIS — S5001XA Contusion of right elbow, initial encounter: Secondary | ICD-10-CM | POA: Diagnosis not present

## 2023-12-08 MED ORDER — BACLOFEN 10 MG PO TABS: 10.0000 mg | ORAL_TABLET | Freq: Three times a day (TID) | ORAL | 0 refills | Status: DC

## 2023-12-08 MED ORDER — IBUPROFEN 800 MG PO TABS
800.0000 mg | ORAL_TABLET | Freq: Three times a day (TID) | ORAL | 0 refills | Status: DC
Start: 2023-12-08 — End: 2024-02-06

## 2023-12-08 MED ORDER — ACETAMINOPHEN 500 MG PO TABS: 500.0000 mg | ORAL_TABLET | Freq: Four times a day (QID) | ORAL | 0 refills | Status: DC | PRN
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
## 2023-12-08 NOTE — Discharge Instructions (Addendum)
-  For your elbow: rest, ice, tylenol /ibuprofen  -Take tylenol  (up to 3000mg /day) and ibuprofen  (up to 3200mg /day) for pain. Take ibuprofen  with food. -Your x-ray looks normal, but I will call tomorrow if it comes back as abnormal.

## 2023-12-09 ENCOUNTER — Ambulatory Visit (HOSPITAL_COMMUNITY): Payer: Self-pay

## 2023-12-21 ENCOUNTER — Inpatient Hospital Stay (HOSPITAL_COMMUNITY)
Admission: AD | Admit: 2023-12-21 | Discharge: 2023-12-22 | Disposition: A | Discharge status: Home / Self Care | Attending: Obstetrics & Gynecology | Admitting: Obstetrics & Gynecology

## 2023-12-21 DIAGNOSIS — Z3202 Encounter for pregnancy test, result negative: Secondary | ICD-10-CM | POA: Insufficient documentation

## 2023-12-21 DIAGNOSIS — R1084 Generalized abdominal pain: Secondary | ICD-10-CM | POA: Diagnosis not present

## 2023-12-21 LAB — HCG, QUANTITATIVE, PREGNANCY: hCG, Beta Chain, Quant, S: <1 m[IU]/mL (ref <5)

## 2023-12-21 LAB — POCT PREGNANCY, URINE: Preg Test, Ur: NEGATIVE

## 2023-12-21 NOTE — MAU Note (Signed)
..  Brooke Salas is a 33 y.o. at Unknown here in MAU reporting: positive pregnancy test last week. Denies pain or bleeding currently. Reports last period was on 11/17/2023 but had spotting on 12/11/2023.    Pain score: 0/10 Vitals:   12/21/23 2200  BP: 118/68  Pulse: 78  Resp: 17  Temp: 98.5 F (36.9 C)  SpO2: 100%     FHT:n/a Lab orders placed from triage:  none
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
## 2023-12-21 NOTE — Discharge Instructions (Signed)
 It was a pleasure taking care of you tonight.  Your urine pregnancy test was negative so we checked your blood.  Your blood pregnancy test also came back negative.  This means that you have not been pregnant in the recent past but your bleeding theoretically could have been from a very early miscarriage.  Impossible to know if that is the case.  If you have any worsening abdominal pain or issues please be evaluated in the emergency department or follow-up with your primary doctor.  I hope you have a wonderful rest of your night!

## 2024-02-05 ENCOUNTER — Other Ambulatory Visit: Payer: Self-pay

## 2024-02-05 ENCOUNTER — Inpatient Hospital Stay (HOSPITAL_COMMUNITY)
Admission: AD | Admit: 2024-02-05 | Discharge: 2024-02-06 | Disposition: A | Discharge status: Home / Self Care | Attending: Obstetrics and Gynecology | Admitting: Obstetrics and Gynecology

## 2024-02-05 DIAGNOSIS — Z3A01 Less than 8 weeks gestation of pregnancy: Secondary | ICD-10-CM | POA: Insufficient documentation

## 2024-02-05 DIAGNOSIS — O4691 Antepartum hemorrhage, unspecified, first trimester: Secondary | ICD-10-CM | POA: Insufficient documentation

## 2024-02-05 DIAGNOSIS — Z711 Person with feared health complaint in whom no diagnosis is made: Secondary | ICD-10-CM

## 2024-02-05 NOTE — MAU Note (Signed)
 Brooke Salas is a 33 y.o. at Unknown here in MAU reporting: took 2 home pregnancy tests - positive. Missed period. Denies pain or VB.   LMP: 12/21/2023 but had 3 days of light VB that started on 10/25 Onset of complaint: NA Pain score: 0 Vitals:   02/05/24 2322  BP: 126/72  Pulse: 76  Resp: 16  Temp: 99.5 F (37.5 C)  SpO2: 100%     FHT: NA  Lab orders placed from triage: none

## 2024-02-06 ENCOUNTER — Encounter (HOSPITAL_COMMUNITY): Payer: Self-pay | Admitting: Obstetrics and Gynecology

## 2024-02-06 DIAGNOSIS — O4691 Antepartum hemorrhage, unspecified, first trimester: Secondary | ICD-10-CM | POA: Diagnosis present

## 2024-02-06 DIAGNOSIS — Z3A01 Less than 8 weeks gestation of pregnancy: Secondary | ICD-10-CM | POA: Diagnosis not present

## 2024-02-06 NOTE — MAU Provider Note (Signed)
 Event Date/Time   First Provider Initiated Contact with Patient 02/06/24 0029      S [name] is a 33 y.o. [phone] patient who presents to MAU today with complaint of missed period and (+) HPT x 2. She reports her LMP as 12/21/2023, but she is unsure. She also reports having 3 days of light bleeding that started on 01/10/2024. She denies any pain or complications with pregnancy. She plans to receive [facility] with [facility]; first appt is 02/19/2024.    O BP 126/72 (BP Location: Right Arm)   Pulse 76   Temp 99.5 F (37.5 C) (Oral)   Resp 16   Ht 5' 5 (1.651 m)   Wt 77.2 kg   LMP 09/02/2023 Comment: BABA 3 months ago  SpO2 100%   BMI 28.31 kg/m   Physical Exam Vitals and nursing note reviewed.  Constitutional:      Appearance: Normal appearance. She is normal weight.  Pulmonary:     Effort: Pulmonary effort is normal.  Genitourinary:    Comments: Not indicated Musculoskeletal:        General: Normal range of motion.  Neurological:     Mental Status: She is alert and oriented to person, place, and time.  Psychiatric:        Mood and Affect: Mood normal.        Behavior: Behavior normal.        Thought Content: Thought content normal.        Judgment: Judgment normal.    A Medical screening exam complete 1. Physically well but worried (Primary)    P Discharge from MAU in stable condition Warning signs for worsening condition that would warrant emergency follow-up discussed Patient may return to MAU as needed for pregnancy complaints  Letha Renshaw, CNM 02/06/2024 12:30 AM

## 2024-02-19 LAB — OB RESULTS CONSOLE RPR: RPR: NONREACTIVE

## 2024-02-19 LAB — OB RESULTS CONSOLE GC/CHLAMYDIA
Chlamydia: NEGATIVE
Neisseria Gonorrhea: NEGATIVE

## 2024-03-29 LAB — OB RESULTS CONSOLE HGB/HCT, BLOOD
HCT: 39 (ref 29–41)
Hemoglobin: 13.6

## 2024-03-29 LAB — OB RESULTS CONSOLE PLATELET COUNT: Platelets: 261

## 2024-04-06 ENCOUNTER — Other Ambulatory Visit: Payer: Self-pay

## 2024-04-06 DIAGNOSIS — Z36 Encounter for antenatal screening for chromosomal anomalies: Secondary | ICD-10-CM

## 2024-04-15 ENCOUNTER — Other Ambulatory Visit: Payer: Self-pay

## 2024-04-15 ENCOUNTER — Ambulatory Visit: Payer: Self-pay | Admitting: Obstetrics and Gynecology

## 2024-04-15 VITALS — BP 123/73 | HR 90 | Wt 171.7 lb

## 2024-04-15 DIAGNOSIS — O0990 Supervision of high risk pregnancy, unspecified, unspecified trimester: Secondary | ICD-10-CM

## 2024-04-15 DIAGNOSIS — Z3A17 17 weeks gestation of pregnancy: Secondary | ICD-10-CM | POA: Diagnosis not present

## 2024-04-15 DIAGNOSIS — O0992 Supervision of high risk pregnancy, unspecified, second trimester: Secondary | ICD-10-CM

## 2024-04-15 DIAGNOSIS — A609 Anogenital herpesviral infection, unspecified: Secondary | ICD-10-CM | POA: Diagnosis not present

## 2024-04-15 NOTE — Progress Notes (Signed)
 "  INITIAL PRENATAL VISIT NOTE  Subjective:  Brooke Salas is a 34 y.o. G4P2002 at [redacted]w[redacted]d by approximate LMP being seen today for her initial prenatal visit. She is a transfer from [facility]. She has an obstetric history significant for postpartum hypertension. No hypertension this pregnancy.  She has a medical history significant for HSV.  Patient reports no complaints.  Contractions: Not present. Vag. Bleeding: None.  Movement: Present. Denies leaking of fluid.    Past Medical History:  Diagnosis Date   Herpes    Hypokalemia 05/18/2013   Nausea & vomiting 05/18/2013   PNA (pneumonia) 05/18/2013    Past Surgical History:  Procedure Laterality Date   NO PAST SURGERIES      OB History  Gravida Para Term Preterm AB Living  4 2 2   2   SAB IAB Ectopic Multiple Live Births     0 2    # Outcome Date GA Lbr Len/2nd Weight Sex Type Anes PTL Lv  4 Current           3 Term 06/07/23 [redacted]w[redacted]d 12:41 / 01:13 7 lb 1.2 oz (3.21 kg) M Vag-Spont EPI  LIV  2 Gravida           1 Term      Vag-Spont   LIV    Social History   Socioeconomic History   Marital status: Single    Spouse name: Not on file   Number of children: Not on file   Years of education: Not on file   Highest education level: Not on file  Occupational History   Occupation: [employer]  Tobacco Use   Smoking status: Every Day    Current packs/day: 1.00    Types: Cigarettes   Smokeless tobacco: Never  Substance and Sexual Activity   Alcohol use: No   Drug use: Never   Sexual activity: Not Currently  Other Topics Concern   Not on file  Social History Narrative   Not on file   Social Drivers of Health   Tobacco Use: High Risk (02/06/2024)   Patient History    Smoking Tobacco Use: Every Day    Smokeless Tobacco Use: Never    Passive Exposure: Not on file  Financial Resource Strain: Not on file  Food Insecurity: Not on file  Transportation Needs: Patient Declined (06/07/2023)   PRAPARE -  Transportation    Lack of Transportation (Medical): Patient declined    Lack of Transportation (Non-Medical): Patient declined  Physical Activity: Not on file  Stress: Not on file  Social Connections: Not on file  Depression (PHQ2-9): Not on file  Alcohol Screen: Not on file  Housing: Not on file  Utilities: Patient Declined (06/07/2023)   [facility] Utilities    Threatened with loss of utilities: Patient declined  Health Literacy: Not on file    Family History  Problem Relation Age of Onset   Hypertension Mother    Heart disease Mother    Diabetes Mother    Cancer Maternal Aunt    Hypertension Maternal Grandmother    Diabetes Maternal Grandmother    Asthma Neg Hx     Current Medications[1]  Allergies[2]  Review of Systems: Negative except for what is mentioned in HPI.  Objective:   Vitals:   04/15/24 1444  BP: 123/73  Pulse: 90  Weight: 171 lb 11.2 oz (77.9 kg)    Fetal Status: Fetal Heart Rate (bpm): 152 Fundal Height: 16 cm Movement: Present     Physical Exam:  BP 123/73   Pulse 90   Wt 171 lb 11.2 oz (77.9 kg)   LMP 12/17/2023 (Approximate)   BMI 28.57 kg/m  CONSTITUTIONAL: Well-developed, well-nourished female in no acute distress.  NEUROLOGIC: Alert and oriented to person, place, and time. Normal reflexes, muscle tone coordination. No cranial nerve deficit noted. PSYCHIATRIC: Normal mood and affect. Normal behavior. Normal judgment and thought content. SKIN: Skin is warm and dry. No rash noted. Not diaphoretic. No erythema. No pallor. HENT:  Normocephalic, atraumatic, External right and left ear normal. Oropharynx is clear and moist EYES: Conjunctivae and EOM are normal.  NECK: Normal range of motion, supple, no masses CARDIOVASCULAR: Normal heart rate noted, regular rhythm RESPIRATORY: Effort and breath sounds normal, no problems with respiration noted BREASTS: deferred ABDOMEN: Soft, nontender, nondistended, gravid. HL:[id number] MUSCULOSKELETAL: Normal  range of motion. EXT:  No edema and no tenderness. 2+ distal pulses.   Assessment and Plan:  Pregnancy: G4P2002 at [redacted]w[redacted]d by approximate LMP  1. Supervision of high risk pregnancy, antepartum (Primary) Continue routine prenatal care Discussed THC use, pt advised to stop if possible  - Protein / creatinine ratio, urine - Comprehensive metabolic panel with GFR - CBC/D/Plt+RPR+Rh+ABO+RubIgG... - PANORAMA PRENATAL TEST - Culture, OB Urine  2. [redacted] weeks gestation of pregnancy   3. HSV (herpes simplex virus) anogenital infection Prophylaxis at 36 weeks   Preterm labor symptoms and general obstetric precautions including but not limited to vaginal bleeding, contractions, leaking of fluid and fetal movement were reviewed in detail with the patient.  Please refer to After Visit Summary for other counseling recommendations.   Return in about 4 weeks (around 05/13/2024) for ROB, in person.  Jerilynn DELENA Buddle 04/15/2024 3:40 PM      [1]  Current Outpatient Medications:    aspirin EC 81 MG tablet, Take 81 mg by mouth daily. Swallow whole., Disp: , Rfl:    Prenatal Vit-Fe Fumarate-FA (MULTIVITAMIN-PRENATAL) 27-0.8 MG TABS tablet, Take 1 tablet by mouth daily at 12 noon., Disp: , Rfl:  [2] No Known Allergies  "

## 2024-04-16 ENCOUNTER — Ambulatory Visit: Payer: Self-pay | Admitting: Obstetrics and Gynecology

## 2024-04-16 LAB — CBC/D/PLT+RPR+RH+ABO+RUBIGG...
Antibody Screen: NEGATIVE
Basophils Absolute: 0 10*3/uL (ref 0.0–0.2)
Basos: 0 %
EOS (ABSOLUTE): 0.1 10*3/uL (ref 0.0–0.4)
Eos: 1 %
HCV Ab: NONREACTIVE
HIV Screen 4th Generation wRfx: NONREACTIVE
Hematocrit: 36.3 % (ref 34.0–46.6)
Hemoglobin: 12.3 g/dL (ref 11.1–15.9)
Hepatitis B Surface Ag: NEGATIVE
Immature Grans (Abs): 0.1 10*3/uL (ref 0.0–0.1)
Immature Granulocytes: 1 %
Lymphocytes Absolute: 2.2 10*3/uL (ref 0.7–3.1)
Lymphs: 20 %
MCH: 32.4 pg (ref 26.6–33.0)
MCHC: 33.9 g/dL (ref 31.5–35.7)
MCV: 96 fL (ref 79–97)
Monocytes Absolute: 0.7 10*3/uL (ref 0.1–0.9)
Monocytes: 6 %
Neutrophils Absolute: 8.4 10*3/uL — ABNORMAL HIGH (ref 1.4–7.0)
Neutrophils: 72 %
Platelets: 266 10*3/uL (ref 150–450)
RBC: 3.8 x10E6/uL (ref 3.77–5.28)
RDW: 13.5 % (ref 11.7–15.4)
RPR Ser Ql: NONREACTIVE
Rh Factor: POSITIVE
Rubella Antibodies, IGG: 1.37 {index}
WBC: 11.5 10*3/uL — ABNORMAL HIGH (ref 3.4–10.8)

## 2024-04-16 LAB — COMPREHENSIVE METABOLIC PANEL WITH GFR
ALT: 14 [IU]/L (ref 0–32)
AST: 18 [IU]/L (ref 0–40)
Albumin: 4.2 g/dL (ref 3.9–4.9)
Alkaline Phosphatase: 71 [IU]/L (ref 41–116)
BUN/Creatinine Ratio: 17 (ref 9–23)
BUN: 11 mg/dL (ref 6–20)
Bilirubin Total: 0.2 mg/dL (ref 0.0–1.2)
CO2: 20 mmol/L (ref 20–29)
Calcium: 9.1 mg/dL (ref 8.7–10.2)
Chloride: 103 mmol/L (ref 96–106)
Creatinine, Ser: 0.63 mg/dL (ref 0.57–1.00)
Globulin, Total: 2 g/dL (ref 1.5–4.5)
Glucose: 92 mg/dL (ref 70–99)
Potassium: 3.9 mmol/L (ref 3.5–5.2)
Sodium: 138 mmol/L (ref 134–144)
Total Protein: 6.2 g/dL (ref 6.0–8.5)
eGFR: 122 mL/min/{1.73_m2}

## 2024-04-16 LAB — HCV INTERPRETATION

## 2024-04-28 ENCOUNTER — Other Ambulatory Visit

## 2024-04-30 ENCOUNTER — Other Ambulatory Visit

## 2024-04-30 ENCOUNTER — Ambulatory Visit

## 2024-05-13 ENCOUNTER — Encounter: Payer: Self-pay | Admitting: Obstetrics & Gynecology
